# Patient Record
Sex: Female | Born: 1995 | Race: Black or African American | Hispanic: No | Marital: Single | Smoking: Never smoker
Health system: Southern US, Community
[De-identification: ages and names within clinical notes are randomized; demographics above are authoritative.]

## PROBLEM LIST (undated history)

## (undated) DIAGNOSIS — Z789 Other specified health status: Secondary | ICD-10-CM

## (undated) HISTORY — PX: NO PAST SURGERIES: SHX2092

---
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
## 2014-11-27 ENCOUNTER — Emergency Department (HOSPITAL_COMMUNITY)
Admission: EM | Admit: 2014-11-27 | Discharge: 2014-11-27 | Disposition: A | Discharge status: Home / Self Care | Payer: Managed Care, Other (non HMO) | Attending: Emergency Medicine | Admitting: Emergency Medicine

## 2014-11-27 ENCOUNTER — Encounter (HOSPITAL_COMMUNITY): Payer: Self-pay | Admitting: Emergency Medicine

## 2014-11-27 ENCOUNTER — Emergency Department (HOSPITAL_COMMUNITY): Payer: Managed Care, Other (non HMO)

## 2014-11-27 DIAGNOSIS — J069 Acute upper respiratory infection, unspecified: Secondary | ICD-10-CM

## 2014-11-27 DIAGNOSIS — R05 Cough: Secondary | ICD-10-CM | POA: Diagnosis present

## 2014-11-27 MED ORDER — ONDANSETRON 4 MG PO TBDP
4.0000 mg | ORAL_TABLET | Freq: Once | ORAL | Status: AC
  Administered 2014-11-27: 4 mg via ORAL
  Filled 2014-11-27: qty 1

## 2014-11-27 MED ORDER — AZITHROMYCIN 250 MG PO TABS: 250.0000 mg | ORAL_TABLET | Freq: Every day | ORAL | Status: DC

## 2014-11-27 NOTE — ED Notes (Signed)
Pt states that she has been having cough and nasal congestion x 2 days.

## 2014-11-27 NOTE — ED Notes (Signed)
Work Engineer, [employer] provided

## 2014-11-27 NOTE — Discharge Instructions (Signed)
Upper Respiratory Infection, Adult Most upper respiratory infections (URIs) are a viral infection of the air passages leading to the lungs. A URI affects the nose, throat, and upper air passages. The most common type of URI is nasopharyngitis and is typically referred to as "the common cold." URIs run their course and usually go away on their own. Most of the time, a URI does not require medical attention, but sometimes a bacterial infection in the upper airways can follow a viral infection. This is called a secondary infection. Sinus and middle ear infections are common types of secondary upper respiratory infections. Bacterial pneumonia can also complicate a URI. A URI can worsen asthma and chronic obstructive pulmonary disease (COPD). Sometimes, these complications can require emergency medical care and may be life threatening.  CAUSES Almost all URIs are caused by viruses. A virus is a type of germ and can spread from one person to another.  RISKS FACTORS You may be at risk for a URI if:   You smoke.   You have chronic heart or lung disease.  You have a weakened defense (immune) system.   You are very young or very old.   You have nasal allergies or asthma.  You work in crowded or poorly ventilated areas.  You work in health care facilities or schools. SIGNS AND SYMPTOMS  Symptoms typically develop 2-3 days after you come in contact with a cold virus. Most viral URIs last 7-10 days. However, viral URIs from the influenza virus (flu virus) can last 14-18 days and are typically more severe. Symptoms may include:   Runny or stuffy (congested) nose.   Sneezing.   Cough.   Sore throat.   Headache.   Fatigue.   Fever.   Loss of appetite.   Pain in your forehead, behind your eyes, and over your cheekbones (sinus pain).  Muscle aches.  DIAGNOSIS  Your health care provider may diagnose a URI by:  Physical exam.  Tests to check that your symptoms are not due to  another condition such as:  Strep throat.  Sinusitis.  Pneumonia.  Asthma. TREATMENT  A URI goes away on its own with time. It cannot be cured with medicines, but medicines may be prescribed or recommended to relieve symptoms. Medicines may help:  Reduce your fever.  Reduce your cough.  Relieve nasal congestion. HOME CARE INSTRUCTIONS   Take medicines only as directed by your health care provider.   Gargle warm saltwater or take cough drops to comfort your throat as directed by your health care provider.  Use a warm mist humidifier or inhale steam from a shower to increase air moisture. This may make it easier to breathe.  Drink enough fluid to keep your urine clear or pale yellow.   Eat soups and other clear broths and maintain good nutrition.   Rest as needed.   Return to work when your temperature has returned to normal or as your health care provider advises. You may need to stay home longer to avoid infecting others. You can also use a face mask and careful hand washing to prevent spread of the virus.  Increase the usage of your inhaler if you have asthma.   Do not use any tobacco products, including cigarettes, chewing tobacco, or electronic cigarettes. If you need help quitting, ask your health care provider. PREVENTION  The best way to protect yourself from getting a cold is to practice good hygiene.   Avoid oral or hand contact with people with cold   symptoms.   Wash your hands often if contact occurs.  There is no clear evidence that vitamin C, vitamin E, echinacea, or exercise reduces the chance of developing a cold. However, it is always recommended to get plenty of rest, exercise, and practice good nutrition.  SEEK MEDICAL CARE IF:   You are getting worse rather than better.   Your symptoms are not controlled by medicine.   You have chills.  You have worsening shortness of breath.  You have brown or red mucus.  You have yellow or brown nasal  discharge.  You have pain in your face, especially when you bend forward.  You have a fever.  You have swollen neck glands.  You have pain while swallowing.  You have white areas in the back of your throat. SEEK IMMEDIATE MEDICAL CARE IF:   You have severe or persistent:  Headache.  Ear pain.  Sinus pain.  Chest pain.  You have chronic lung disease and any of the following:  Wheezing.  Prolonged cough.  Coughing up blood.  A change in your usual mucus.  You have a stiff neck.  You have changes in your:  Vision.  Hearing.  Thinking.  Mood. MAKE SURE YOU:   Understand these instructions.  Will watch your condition.  Will get help right away if you are not doing well or get worse.   This information is not intended to replace advice given to you by your health care provider. Make sure you discuss any questions you have with your health care provider.   Document Released: 07/06/2000 Document Revised: 05/27/2014 Document Reviewed: 04/17/2013 Elsevier Interactive Patient Education 2016 [employer]  

## 2014-11-27 NOTE — ED Provider Notes (Signed)
CSN: [id number]     Arrival date & time 11/27/14  0944 History   First MD Initiated Contact with Patient 11/27/14 0945     Chief Complaint  Patient presents with  . Cough  . Nasal Congestion     (Consider location/radiation/quality/duration/timing/severity/associated sxs/prior Treatment) HPI  Patient to the ER for evaluation of cough and nasal congestion for the past two days. She has not had any fevers, nausea, vomiting, diarrhea, chest pain, weakness, confusion, wheezing. She denies sore throat, ear pain, neck pain or headaches. Her cough is dry and sometimes productive of light green sputum.   History reviewed. No pertinent past medical history. No past surgical history on file. No family history on file. Social History  Substance Use Topics  . Smoking status: Never Smoker   . Smokeless tobacco: None  . Alcohol Use: No   OB History    No data available     Review of Systems  10 Systems reviewed and are negative for acute change except as noted in the HPI.    Allergies  Review of patient's allergies indicates no known allergies.  Home Medications   Prior to Admission medications   Medication Sig Start Date End Date Taking? Authorizing Provider  azithromycin (ZITHROMAX) 250 MG tablet Take 1 tablet (250 mg total) by mouth daily. Take first 2 tablets together, then 1 every day until finished. 11/27/14   Jasline Buskirk Neva SeatGreene, PA-C   BP 129/74 mmHg  Pulse 105  Temp(Src) 98.6 F (37 C) (Oral)  Resp 19  SpO2 100%  LMP 11/13/2014 Physical Exam  Constitutional: She appears well-developed and well-nourished. No distress.  HENT:  Head: Normocephalic and atraumatic.  Eyes: Pupils are equal, round, and reactive to light.  Neck: Normal range of motion. Neck supple.  Cardiovascular: Normal rate and regular rhythm.   Pulmonary/Chest: Effort normal.  Abdominal: Soft.  Neurological: She is alert.  Skin: Skin is warm and dry.  Nursing note and vitals reviewed.     ED Course   Procedures (including critical care time) Labs Review Labs Reviewed - No data to display  Imaging Review Dg Chest 2 View  11/27/2014  CLINICAL DATA:  Cough and congestion.  Chest soreness for 2 days EXAM: CHEST  2 VIEW COMPARISON:  None. FINDINGS: Lungs are clear. The heart size and pulmonary vascularity are normal. No adenopathy. No pneumothorax. No bone lesions. IMPRESSION: No edema or consolidation. Electronically Signed   By: Bretta BangWilliam  Woodruff III M.D.   On: 11/27/2014 10:23   I have personally reviewed and evaluated these images and lab results as part of my medical decision-making.   EKG Interpretation None      MDM   Final diagnoses:  URI (upper respiratory infection)    Pt initially tachycardic then given 3 cups of water and her pulse improved to 103 in the room. Patient advised she needs to drink more water. Otherwise negative chest xray. No systemic symptoms or fevers.  Rx: Z-pack  Medications  ondansetron (ZOFRAN-ODT) disintegrating tablet 4 mg (not administered)    19 y.o.Savannah Carr's medical screening exam was performed and I feel the patient has had an appropriate workup for their chief complaint at this time and likelihood of emergent condition existing is low. They have been counseled on decision, discharge, follow up and which symptoms necessitate immediate return to the emergency department. They or their family verbally stated understanding and agreement with plan and discharged in stable condition.   Vital signs are stable at discharge. Filed Vitals:  11/27/14 1102  BP: 129/74  Pulse: 105  Temp:   Resp: [address], PA-C 11/27/14 1132  Arby Barrette, MD 11/27/14 [phone]

## 2014-11-27 NOTE — ED Notes (Signed)
Bed: WTR5 Expected date:  Expected time:  Means of arrival:  Comments: 

## 2015-03-18 ENCOUNTER — Other Ambulatory Visit: Payer: Self-pay | Admitting: Family Medicine

## 2015-03-18 ENCOUNTER — Other Ambulatory Visit (HOSPITAL_COMMUNITY)
Admission: RE | Admit: 2015-03-18 | Discharge: 2015-03-18 | Disposition: A | Discharge status: Home / Self Care | Payer: Managed Care, Other (non HMO) | Source: Ambulatory Visit | Attending: Family Medicine | Admitting: Family Medicine

## 2015-03-18 DIAGNOSIS — Z1151 Encounter for screening for human papillomavirus (HPV): Secondary | ICD-10-CM | POA: Diagnosis present

## 2015-03-18 DIAGNOSIS — Z01411 Encounter for gynecological examination (general) (routine) with abnormal findings: Secondary | ICD-10-CM | POA: Diagnosis present

## 2015-03-18 DIAGNOSIS — Z113 Encounter for screening for infections with a predominantly sexual mode of transmission: Secondary | ICD-10-CM | POA: Diagnosis present

## 2015-03-19 LAB — CYTOLOGY - PAP

## 2015-10-18 ENCOUNTER — Emergency Department (HOSPITAL_COMMUNITY)
Admission: EM | Admit: 2015-10-18 | Discharge: 2015-10-19 | Disposition: A | Discharge status: Home / Self Care | Payer: Managed Care, Other (non HMO) | Attending: Emergency Medicine | Admitting: Emergency Medicine

## 2015-10-18 ENCOUNTER — Emergency Department (HOSPITAL_COMMUNITY): Payer: Managed Care, Other (non HMO)

## 2015-10-18 ENCOUNTER — Encounter (HOSPITAL_COMMUNITY): Payer: Self-pay | Admitting: Emergency Medicine

## 2015-10-18 DIAGNOSIS — Z792 Long term (current) use of antibiotics: Secondary | ICD-10-CM | POA: Insufficient documentation

## 2015-10-18 DIAGNOSIS — R05 Cough: Secondary | ICD-10-CM | POA: Diagnosis present

## 2015-10-18 DIAGNOSIS — J069 Acute upper respiratory infection, unspecified: Secondary | ICD-10-CM | POA: Insufficient documentation

## 2015-10-18 MED ORDER — HYDROCODONE-HOMATROPINE 5-1.5 MG/5ML PO SYRP
5.0000 mL | ORAL_SOLUTION | Freq: Once | ORAL | Status: AC
  Administered 2015-10-18: 5 mL via ORAL
  Filled 2015-10-18: qty 5

## 2015-10-18 NOTE — ED Provider Notes (Signed)
[facility]-EMERGENCY DEPT Provider Note   CSN: [id number] Arrival date & time: 10/18/15  2248     History   Chief Complaint Chief Complaint  Patient presents with  . Cough  . Back Pain  . Generalized Body Aches    HPI Savannah Carr is a 20 y.o. female.  Patient presents to the ED with a chief complaint of cough.  She states that her symptoms started 2 days ago.  She states that she has associated sore throat, rhinorrhea, and body aches.  She endorses sick contacts at work.  Denies any history of asthma or COPD.  She denies any fever or chills.  There are no modifying factors.     The history is provided by the patient. No language interpreter was used.    History reviewed. No pertinent past medical history.  There are no active problems to display for this patient.   History reviewed. No pertinent surgical history.  OB History    No data available       Home Medications    Prior to Admission medications   Medication Sig Start Date End Date Taking? Authorizing Provider  azithromycin (ZITHROMAX) 250 MG tablet Take 1 tablet (250 mg total) by mouth daily. Take first 2 tablets together, then 1 every day until finished. 11/27/14   Marlon Pel, PA-C    Family History No family history on file.  Social History Social History  Substance Use Topics  . Smoking status: Never Smoker  . Smokeless tobacco: Never Used  . Alcohol use No     Allergies   Review of patient's allergies indicates no known allergies.   Review of Systems Review of Systems  Constitutional: Negative for chills and fever.  HENT: Positive for postnasal drip, rhinorrhea, sinus pressure, sneezing and sore throat.   Respiratory: Positive for cough. Negative for shortness of breath.   Cardiovascular: Negative for chest pain.  Gastrointestinal: Negative for abdominal pain, constipation, diarrhea, nausea and vomiting.  Genitourinary: Negative for dysuria.  All other systems reviewed and are  negative.    Physical Exam Updated Vital Signs BP 107/62 (BP Location: Left Arm)   Pulse 115   Temp 98.3 F (36.8 C) (Oral)   Ht 5\' 7"  (1.702 m)   LMP 07/18/2015 (Approximate)   SpO2 94%   Physical Exam Physical Exam  Constitutional: Pt  is oriented to person, place, and time. Appears well-developed and well-nourished. No distress. Coughing throughout exam HENT:  Head: Normocephalic and atraumatic.  Right Ear: Tympanic membrane, external ear and ear canal normal.  Left Ear: Tympanic membrane, external ear and ear canal normal.  Nose: Mucosal edema and moderate rhinorrhea present. No epistaxis. Right sinus exhibits no maxillary sinus tenderness and no frontal sinus tenderness. Left sinus exhibits no maxillary sinus tenderness and no frontal sinus tenderness.  Mouth/Throat: Uvula is midline and mucous membranes are normal. Mucous membranes are not pale and not cyanotic. No oropharyngeal exudate, moderate posterior oropharyngeal edema, posterior oropharyngeal erythema or tonsillar abscesses.  Eyes: Conjunctivae are normal. Pupils are equal, round, and reactive to light.  Neck: Normal range of motion and full passive range of motion without pain.  Cardiovascular: Normal rate and intact distal pulses.   Pulmonary/Chest: Effort normal and breath sounds normal. No stridor.  Clear and equal breath sounds without focal wheezes, rhonchi, rales  Abdominal: Soft. Bowel sounds are normal. There is no tenderness.  Musculoskeletal: Normal range of motion.  Lymphadenopathy:    Pthas no cervical adenopathy.  Neurological: Pt is alert  and oriented to person, place, and time.  Skin: Skin is warm and dry. No rash noted. Pt is not diaphoretic.  Psychiatric: Normal mood and affect.  Nursing note and vitals reviewed.    ED Treatments / Results  Labs (all labs ordered are listed, but only abnormal results are displayed) Labs Reviewed - No data to display  EKG  EKG Interpretation None        Radiology Dg Chest 2 View  Result Date: 10/18/2015 CLINICAL DATA:  Acute onset of shortness of breath, wheezing and dry cough. Central chest pain. Initial encounter. EXAM: CHEST  2 VIEW COMPARISON:  Chest radiograph performed 11/27/2014 FINDINGS: The lungs are well-aerated and clear. There is no evidence of focal opacification, pleural effusion or pneumothorax. The heart is normal in size; the mediastinal contour is within normal limits. No acute osseous abnormalities are seen. A metallic piercing is noted at the right nipple. IMPRESSION: No acute cardiopulmonary process seen. Electronically Signed   By: Roanna RaiderJeffery  Chang M.D.   On: 10/18/2015 23:18    Procedures Procedures (including critical care time)  Medications Ordered in ED Medications  HYDROcodone-homatropine (HYCODAN) 5-1.5 MG/5ML syrup 5 mL (5 mLs Oral Given 10/18/15 2336)     Initial Impression / Assessment and Plan / ED Course  I have reviewed the triage vital signs and the nursing notes.  Pertinent labs & imaging results that were available during my care of the patient were reviewed by me and considered in my medical decision making (see chart for details).  Clinical Course    Pt CXR negative for acute infiltrate.  Patient mildly tachycardic, but coughing through the exam, her symptoms are classic for URI.  I do not suspect PE. Patients symptoms are consistent with URI, likely viral etiology. Discussed that antibiotics are not indicated for viral infections. Pt will be discharged with symptomatic treatment.  Verbalizes understanding and is agreeable with plan. Pt is hemodynamically stable & in NAD prior to dc.   Final Clinical Impressions(s) / ED Diagnoses   Final diagnoses:  URI (upper respiratory infection)    New Prescriptions New Prescriptions   HYDROCODONE-HOMATROPINE (HYCODAN) 5-1.5 MG/5ML SYRUP    Take 5 mLs by mouth every 6 (six) hours as needed for cough.     Roxy Horsemanobert Auda Finfrock, PA-C 10/19/15 0025     Tilden FossaElizabeth Rees, MD 10/20/15 [phone]

## 2015-10-18 NOTE — ED Notes (Signed)
Patient transported to X-ray 

## 2015-10-18 NOTE — ED Triage Notes (Signed)
Pt from home with complaints of dry cough and lower back pain that all began yesterday. Back pain is located in the center of her lower back. Pt is tachycardic at time of assessment with a HR or 115/min at her radial pulse. Pt has clear lungs and has an oxygen saturation of 94% on RA. Pt states she is exposed to lots of different people at work so she could have been around someone sick.

## 2015-10-19 MED ORDER — HYDROCODONE-HOMATROPINE 5-1.5 MG/5ML PO SYRP: 5.0000 mL | ORAL_SOLUTION | Freq: Four times a day (QID) | ORAL | 0 refills | Status: DC | PRN

## 2015-10-19 MED ORDER — ALBUTEROL SULFATE HFA 108 (90 BASE) MCG/ACT IN AERS
2.0000 | INHALATION_SPRAY | RESPIRATORY_TRACT | Status: DC | PRN
  Filled 2015-10-19: qty 6.7

## 2015-10-19 NOTE — ED Notes (Signed)
Pt ambulatory and independent at discharge.  Verbalized understanding of discharge instructions 

## 2016-03-22 ENCOUNTER — Other Ambulatory Visit: Payer: Self-pay | Admitting: Family Medicine

## 2016-03-22 ENCOUNTER — Other Ambulatory Visit (HOSPITAL_COMMUNITY)
Admission: RE | Admit: 2016-03-22 | Discharge: 2016-03-22 | Disposition: A | Discharge status: Home / Self Care | Payer: Managed Care, Other (non HMO) | Source: Ambulatory Visit | Attending: Family Medicine | Admitting: Family Medicine

## 2016-03-22 DIAGNOSIS — Z0000 Encounter for general adult medical examination without abnormal findings: Secondary | ICD-10-CM | POA: Diagnosis present

## 2016-03-22 DIAGNOSIS — Z113 Encounter for screening for infections with a predominantly sexual mode of transmission: Secondary | ICD-10-CM | POA: Insufficient documentation

## 2016-03-25 LAB — CYTOLOGY - PAP
CHLAMYDIA, DNA PROBE: NEGATIVE
DIAGNOSIS: NEGATIVE
NEISSERIA GONORRHEA: NEGATIVE

## 2018-10-11 ENCOUNTER — Encounter (HOSPITAL_COMMUNITY): Payer: Self-pay | Admitting: *Deleted

## 2018-10-11 ENCOUNTER — Other Ambulatory Visit: Payer: Self-pay

## 2018-10-11 ENCOUNTER — Inpatient Hospital Stay (HOSPITAL_COMMUNITY)
Admission: AD | Admit: 2018-10-11 | Discharge: 2018-10-11 | Disposition: A | Discharge status: Home / Self Care | Payer: Managed Care, Other (non HMO) | Attending: Obstetrics and Gynecology | Admitting: Obstetrics and Gynecology

## 2018-10-11 DIAGNOSIS — O219 Vomiting of pregnancy, unspecified: Secondary | ICD-10-CM

## 2018-10-11 DIAGNOSIS — Z3A01 Less than 8 weeks gestation of pregnancy: Secondary | ICD-10-CM | POA: Diagnosis not present

## 2018-10-11 DIAGNOSIS — O210 Mild hyperemesis gravidarum: Secondary | ICD-10-CM | POA: Diagnosis present

## 2018-10-11 DIAGNOSIS — E860 Dehydration: Secondary | ICD-10-CM

## 2018-10-11 DIAGNOSIS — O211 Hyperemesis gravidarum with metabolic disturbance: Secondary | ICD-10-CM | POA: Diagnosis not present

## 2018-10-11 LAB — COMPREHENSIVE METABOLIC PANEL
ALT: 16 U/L (ref 0–44)
AST: 17 U/L (ref 15–41)
Albumin: 4.4 g/dL (ref 3.5–5.0)
Alkaline Phosphatase: 47 U/L (ref 38–126)
Anion gap: 13 (ref 5–15)
BUN: 5 mg/dL — ABNORMAL LOW (ref 6–20)
CO2: 22 mmol/L (ref 22–32)
Calcium: 9.8 mg/dL (ref 8.9–10.3)
Chloride: 99 mmol/L (ref 98–111)
Creatinine, Ser: 0.69 mg/dL (ref 0.44–1.00)
GFR calc Af Amer: >60 mL/min (ref >60)
GFR calc non Af Amer: >60 mL/min (ref >60)
Glucose, Bld: 83 mg/dL (ref 70–99)
Potassium: 3.8 mmol/L (ref 3.5–5.1)
Sodium: 134 mmol/L — ABNORMAL LOW (ref 135–145)
Total Bilirubin: 0.7 mg/dL (ref 0.3–1.2)
Total Protein: 8.1 g/dL (ref 6.5–8.1)

## 2018-10-11 LAB — URINALYSIS, ROUTINE W REFLEX MICROSCOPIC
Bacteria, UA: NONE SEEN
Bilirubin Urine: NEGATIVE
Glucose, UA: NEGATIVE mg/dL
Hgb urine dipstick: NEGATIVE
Ketones, ur: 80 mg/dL — AB
Leukocytes,Ua: NEGATIVE
Nitrite: NEGATIVE
Protein, ur: 30 mg/dL — AB
Specific Gravity, Urine: 1.024 (ref 1.005–1.030)
pH: 6 (ref 5.0–8.0)

## 2018-10-11 LAB — CBC
HCT: 42.6 % (ref 36.0–46.0)
Hemoglobin: 15.1 g/dL — ABNORMAL HIGH (ref 12.0–15.0)
MCH: 32.1 pg (ref 26.0–34.0)
MCHC: 35.4 g/dL (ref 30.0–36.0)
MCV: 90.6 fL (ref 80.0–100.0)
Platelets: 298 10*3/uL (ref 150–400)
RBC: 4.7 MIL/uL (ref 3.87–5.11)
RDW: 11 % — ABNORMAL LOW (ref 11.5–15.5)
WBC: 7.5 10*3/uL (ref 4.0–10.5)
nRBC: 0 % (ref 0.0–0.2)

## 2018-10-11 LAB — POCT PREGNANCY, URINE: Preg Test, Ur: POSITIVE — AB

## 2018-10-11 MED ORDER — LACTATED RINGERS IV BOLUS
1000.0000 mL | Freq: Once | INTRAVENOUS | Status: AC
  Administered 2018-10-11: 1000 mL via INTRAVENOUS

## 2018-10-11 MED ORDER — METOCLOPRAMIDE HCL 10 MG PO TABS: 10.0000 mg | ORAL_TABLET | Freq: Three times a day (TID) | ORAL | 2 refills | Status: DC

## 2018-10-11 MED ORDER — FAMOTIDINE IN NACL 20-0.9 MG/50ML-% IV SOLN
20.0000 mg | Freq: Once | INTRAVENOUS | Status: AC
  Administered 2018-10-11: 20 mg via INTRAVENOUS
  Filled 2018-10-11: qty 50

## 2018-10-11 MED ORDER — PROMETHAZINE HCL 25 MG PO TABS: 12.5000 mg | ORAL_TABLET | Freq: Four times a day (QID) | ORAL | 2 refills | Status: DC | PRN

## 2018-10-11 MED ORDER — PROMETHAZINE HCL 25 MG/ML IJ SOLN
12.5000 mg | Freq: Once | INTRAMUSCULAR | Status: AC
  Administered 2018-10-11: 12.5 mg via INTRAVENOUS
  Filled 2018-10-11: qty 1

## 2018-10-11 NOTE — MAU Note (Signed)
Has been really nauseous for a couple days, unable to hold anything down.  Is feeling really weak. Has tried some type of medication for nausea, unable to keep it down.  Has not been seen with preg.

## 2018-10-11 NOTE — MAU Provider Note (Signed)
Chief Complaint: Nausea and Emesis   First Provider Initiated Contact with Patient 10/11/18 1603      SUBJECTIVE HPI: Savannah Carr is a 23 y.o. G1P0 at [redacted]w[redacted]d by LMP who presents to maternity admissions reporting nausea and vomiting x 2 days.  She reports vomiting x 2 in 24 hours but is unable to keep down any liquids or food.  She denies any pain or vaginal bleeding. There are no other symptoms.  She has not tried any treatments. She has new OB visit next week with [facility].   HPI  History reviewed. No pertinent past medical history. History reviewed. No pertinent surgical history. Social History   Socioeconomic History  . Marital status: Single    Spouse name: Not on file  . Number of children: Not on file  . Years of education: Not on file  . Highest education level: Not on file  Occupational History  . Not on file  Social Needs  . Financial resource strain: Not on file  . Food insecurity    Worry: Not on file    Inability: Not on file  . Transportation needs    Medical: Not on file    Non-medical: Not on file  Tobacco Use  . Smoking status: Never Smoker  . Smokeless tobacco: Never Used  Substance and Sexual Activity  . Alcohol use: No  . Drug use: No  . Sexual activity: Not Currently  Lifestyle  . Physical activity    Days per week: Not on file    Minutes per session: Not on file  . Stress: Not on file  Relationships  . Social Herbalist on phone: Not on file    Gets together: Not on file    Attends religious service: Not on file    Active member of club or organization: Not on file    Attends meetings of clubs or organizations: Not on file    Relationship status: Not on file  . Intimate partner violence    Fear of current or ex partner: Not on file    Emotionally abused: Not on file    Physically abused: Not on file    Forced sexual activity: Not on file  Other Topics Concern  . Not on file  Social History Narrative  . Not on file    No current facility-administered medications on file prior to encounter.    No current outpatient medications on file prior to encounter.   No Known Allergies  ROS:  Review of Systems  Constitutional: Negative for chills, fatigue and fever.  Respiratory: Negative for shortness of breath.   Cardiovascular: Negative for chest pain.  Gastrointestinal: Positive for nausea and vomiting.  Genitourinary: Positive for pelvic pain. Negative for difficulty urinating, dysuria, flank pain, vaginal bleeding, vaginal discharge and vaginal pain.  Neurological: Negative for dizziness and headaches.  Psychiatric/Behavioral: Negative.      I have reviewed patient's Past Medical Hx, Surgical Hx, Family Hx, Social Hx, medications and allergies.   Physical Exam   Patient Vitals for the past 24 hrs:  BP Temp Temp src Pulse Resp SpO2 Height Weight  10/11/18 1857 110/60 - - 98 - - - -  10/11/18 1353 121/70 98.3 F (36.8 C) Oral (!) 112 16 100 % 5\' 7"  (1.702 m) 59.6 kg   Constitutional: Well-developed, well-nourished female in no acute distress.  Cardiovascular: normal rate Respiratory: normal effort GI: Abd soft, non-tender. Pos BS x 4 MS: Extremities nontender, no edema, normal  ROM Neurologic: Alert and oriented x 4.  GU: Neg CVAT.  PELVIC EXAM: Deferred   LAB RESULTS Results for orders placed or performed during the hospital encounter of 10/11/18 (from the past 24 hour(s))  Urinalysis, Routine w reflex microscopic     Status: Abnormal   Collection Time: 10/11/18  2:00 PM  Result Value Ref Range   Color, Urine YELLOW YELLOW   APPearance HAZY (A) CLEAR   Specific Gravity, Urine 1.024 1.005 - 1.030   pH 6.0 5.0 - 8.0   Glucose, UA NEGATIVE NEGATIVE mg/dL   Hgb urine dipstick NEGATIVE NEGATIVE   Bilirubin Urine NEGATIVE NEGATIVE   Ketones, ur 80 (A) NEGATIVE mg/dL   Protein, ur 30 (A) NEGATIVE mg/dL   Nitrite NEGATIVE NEGATIVE   Leukocytes,Ua NEGATIVE NEGATIVE   RBC / HPF 0-5 0 - 5  RBC/hpf   WBC, UA 0-5 0 - 5 WBC/hpf   Bacteria, UA NONE SEEN NONE SEEN   Squamous Epithelial / LPF 0-5 0 - 5   Mucus PRESENT   Pregnancy, urine POC     Status: Abnormal   Collection Time: 10/11/18  2:00 PM  Result Value Ref Range   Preg Test, Ur POSITIVE (A) NEGATIVE  CBC     Status: Abnormal   Collection Time: 10/11/18  3:57 PM  Result Value Ref Range   WBC 7.5 4.0 - 10.5 K/uL   RBC 4.70 3.87 - 5.11 MIL/uL   Hemoglobin 15.1 (H) 12.0 - 15.0 g/dL   HCT 16.142.6 09.636.0 - 04.546.0 %   MCV 90.6 80.0 - 100.0 fL   MCH 32.1 26.0 - 34.0 pg   MCHC 35.4 30.0 - 36.0 g/dL   RDW 40.911.0 (L) 81.111.5 - 91.415.5 %   Platelets 298 150 - 400 K/uL   nRBC 0.0 0.0 - 0.2 %  Comprehensive metabolic panel     Status: Abnormal   Collection Time: 10/11/18  3:57 PM  Result Value Ref Range   Sodium 134 (L) 135 - 145 mmol/L   Potassium 3.8 3.5 - 5.1 mmol/L   Chloride 99 98 - 111 mmol/L   CO2 22 22 - 32 mmol/L   Glucose, Bld 83 70 - 99 mg/dL   BUN 5 (L) 6 - 20 mg/dL   Creatinine, Ser 7.820.69 0.44 - 1.00 mg/dL   Calcium 9.8 8.9 - 95.610.3 mg/dL   Total Protein 8.1 6.5 - 8.1 g/dL   Albumin 4.4 3.5 - 5.0 g/dL   AST 17 15 - 41 U/L   ALT 16 0 - 44 U/L   Alkaline Phosphatase 47 38 - 126 U/L   Total Bilirubin 0.7 0.3 - 1.2 mg/dL   GFR calc non Af Amer >60 >60 mL/min   GFR calc Af Amer >60 >60 mL/min   Anion gap 13 5 - 15       IMAGING No results found.  MAU Management/MDM: Orders Placed This Encounter  Procedures  . Urinalysis, Routine w reflex microscopic  . CBC  . Comprehensive metabolic panel  . Pregnancy, urine POC  . Discharge patient    Meds ordered this encounter  Medications  . lactated ringers bolus 1,000 mL  . promethazine (PHENERGAN) injection 12.5 mg  . famotidine (PEPCID) IVPB 20 mg premix  . lactated ringers bolus 1,000 mL  . metoCLOPramide (REGLAN) 10 MG tablet    Sig: Take 1 tablet (10 mg total) by mouth 3 (three) times daily before meals.    Dispense:  30 tablet    Refill:  2  Order Specific  Question:   Supervising Provider    Answer:   Adam Phenix [[id number]]  . promethazine (PHENERGAN) 25 MG tablet    Sig: Take 0.5-1 tablets (12.5-25 mg total) by mouth every 6 (six) hours as needed for nausea.    Dispense:  30 tablet    Refill:  2    Order Specific Question:   Supervising Provider    Answer:   Adam Phenix [[id number]]    Pt with mild dehydration on UA so IV fluids x 2 liters given along with Phenergan 12.5 mg IV and Pepcid 20 mg IV. Pt with significant improvement and tolerated PO food/fluids.  D/C home. Rx for Reglan TID with meals and Phenergan Q HS PRN. F/U with early prenatal care as planned. Pt discharged with strict return precautions.  ASSESSMENT 1. Nausea and vomiting during pregnancy prior to [redacted] weeks gestation   2. Mild dehydration     PLAN Discharge home Allergies as of 10/11/2018   No Known Allergies     Medication List    STOP taking these medications   azithromycin 250 MG tablet Commonly known as: ZITHROMAX   HYDROcodone-homatropine 5-1.5 MG/5ML syrup Commonly known as: HYCODAN     TAKE these medications   metoCLOPramide 10 MG tablet Commonly known as: REGLAN Take 1 tablet (10 mg total) by mouth 3 (three) times daily before meals.   promethazine 25 MG tablet Commonly known as: PHENERGAN Take 0.5-1 tablets (12.5-25 mg total) by mouth every 6 (six) hours as needed for nausea.      Follow-up Information    Gynecology, [facility]etric[facility] And Follow up.   Specialty: Obstetrics and Gynecology Why: As scheduled, return to MAU as needed for emergencies. Contact information: [address] [place] [address] [phone]           Sharen Counter Certified Nurse-Midwife 10/11/2018  7:52 PM

## 2018-11-16 ENCOUNTER — Other Ambulatory Visit: Payer: Self-pay | Admitting: Obstetrics and Gynecology

## 2018-11-16 ENCOUNTER — Other Ambulatory Visit (HOSPITAL_COMMUNITY)
Admission: RE | Admit: 2018-11-16 | Discharge: 2018-11-16 | Disposition: A | Discharge status: Home / Self Care | Payer: Managed Care, Other (non HMO) | Source: Ambulatory Visit | Attending: Obstetrics and Gynecology | Admitting: Obstetrics and Gynecology

## 2018-11-16 DIAGNOSIS — Z3490 Encounter for supervision of normal pregnancy, unspecified, unspecified trimester: Secondary | ICD-10-CM | POA: Insufficient documentation

## 2018-11-19 ENCOUNTER — Other Ambulatory Visit: Payer: Self-pay

## 2018-11-19 ENCOUNTER — Encounter (HOSPITAL_COMMUNITY): Payer: Self-pay

## 2018-11-19 ENCOUNTER — Inpatient Hospital Stay (HOSPITAL_COMMUNITY)
Admission: AD | Admit: 2018-11-19 | Discharge: 2018-11-19 | Disposition: A | Discharge status: Home / Self Care | Payer: Managed Care, Other (non HMO) | Attending: Obstetrics and Gynecology | Admitting: Obstetrics and Gynecology

## 2018-11-19 DIAGNOSIS — R42 Dizziness and giddiness: Secondary | ICD-10-CM | POA: Diagnosis present

## 2018-11-19 DIAGNOSIS — O26891 Other specified pregnancy related conditions, first trimester: Secondary | ICD-10-CM | POA: Diagnosis not present

## 2018-11-19 DIAGNOSIS — R55 Syncope and collapse: Secondary | ICD-10-CM | POA: Diagnosis not present

## 2018-11-19 DIAGNOSIS — Z3A12 12 weeks gestation of pregnancy: Secondary | ICD-10-CM | POA: Insufficient documentation

## 2018-11-19 HISTORY — DX: Other specified health status: Z78.9

## 2018-11-19 LAB — URINALYSIS, ROUTINE W REFLEX MICROSCOPIC
Bacteria, UA: NONE SEEN
Bilirubin Urine: NEGATIVE
Glucose, UA: NEGATIVE mg/dL
Hgb urine dipstick: NEGATIVE
Ketones, ur: NEGATIVE mg/dL
Leukocytes,Ua: NEGATIVE
Nitrite: NEGATIVE
Protein, ur: NEGATIVE mg/dL
Specific Gravity, Urine: 1.015 (ref 1.005–1.030)
pH: 7.5 (ref 5.0–8.0)

## 2018-11-19 LAB — BASIC METABOLIC PANEL
Anion gap: 8 (ref 5–15)
BUN: 8 mg/dL (ref 6–20)
CO2: 21 mmol/L — ABNORMAL LOW (ref 22–32)
Calcium: 9 mg/dL (ref 8.9–10.3)
Chloride: 105 mmol/L (ref 98–111)
Creatinine, Ser: 0.54 mg/dL (ref 0.44–1.00)
GFR calc Af Amer: >60 mL/min (ref >60)
GFR calc non Af Amer: >60 mL/min (ref >60)
Glucose, Bld: 81 mg/dL (ref 70–99)
Potassium: 3.9 mmol/L (ref 3.5–5.1)
Sodium: 134 mmol/L — ABNORMAL LOW (ref 135–145)

## 2018-11-19 LAB — CBC
HCT: 31.1 % — ABNORMAL LOW (ref 36.0–46.0)
Hemoglobin: 10.9 g/dL — ABNORMAL LOW (ref 12.0–15.0)
MCH: 32.7 pg (ref 26.0–34.0)
MCHC: 35 g/dL (ref 30.0–36.0)
MCV: 93.4 fL (ref 80.0–100.0)
Platelets: 314 10*3/uL (ref 150–400)
RBC: 3.33 MIL/uL — ABNORMAL LOW (ref 3.87–5.11)
RDW: 12 % (ref 11.5–15.5)
WBC: 7.6 10*3/uL (ref 4.0–10.5)
nRBC: 0 % (ref 0.0–0.2)

## 2018-11-19 NOTE — Discharge Instructions (Signed)
It sounds like you had an episode of vasovagal near syncope which can cause you the sensation of feeling like you are about to pass out due to lack of blood flow to your brain. It can occur when you change positions too quickly, when you are stressed, when you are dehydrated and for a variety of other non-emergent reasons. Your urinalysis did not show any evidence of infection or dehydration. Your blood counts were normal without elevated inflammation/infection markers and not significant anemia (low iron counts). Your electrolytes and kidneys were normal. Your vital signs were all normal as well. The EKG showed a normal heart rhythm. Everything looked very reassuring. Make sure you get plenty of rest, drink lots of fluid, and eat frequently throughout the day.   Near-Syncope Near-syncope is when you suddenly feel like you might pass out (faint), but you do not actually lose consciousness. This may also be referred to as presyncope. During an episode of near-syncope, you may:  Feel dizzy, weak, or light-headed.  Feel nauseous.  See all white or all black in your field of vision, or see spots.  Have cold, clammy skin. This condition is caused by a sudden decrease in blood flow to the brain. This decrease can result from various causes, but most of those causes are not dangerous. However, near-syncope may be a sign of a serious medical problem, so it is important to seek medical care. Follow these instructions at home: Medicines  Take over-the-counter and prescription medicines only as told by your health care provider.  If you are taking blood pressure or heart medicine, get up slowly and take several minutes to sit and then stand. This can reduce dizziness. General instructions  Pay attention to any changes in your symptoms.  Talk with your health care provider about your symptoms. You may need to have testing to understand the cause of your near-syncope.  If you start to feel like you might  faint, lie down right away and raise (elevate) your feet above the level of your heart. Breathe deeply and steadily. Wait until all of the symptoms have passed.  Have someone stay with you until you feel stable.  Do not drive, use machinery, or play sports until your health care provider says it is okay.  Drink enough fluid to keep your urine pale yellow.  Keep all follow-up visits as told by your health care provider. This is important. Get help right away if you:  Have a seizure.  Have unusual pain in your chest, abdomen, or back.  Faint once or repeatedly.  Have a severe headache.  Are bleeding from your mouth or rectum, or you have black or tarry stool.  Have a very fast or irregular heartbeat (palpitations).  Are confused.  Have trouble walking.  Have severe weakness.  Have vision problems. These symptoms may represent a serious problem that is an emergency. Do not wait to see if your symptoms will go away. Get medical help right away. Call your local emergency services (911 in the [place] Do not drive yourself to the hospital. Summary  Near-syncope is when you suddenly feel like you might pass out (faint), but you do not actually lose consciousness.  This condition is caused by a sudden decrease in blood flow to the brain. This decrease can result from various causes, but most of those causes are not dangerous.  Near-syncope may be a sign of a serious medical problem, so it is important to seek medical care. This information is not  intended to replace advice given to you by your health care provider. Make sure you discuss any questions you have with your health care provider. Document Released: 01/10/2005 Document Revised: 05/04/2018 Document Reviewed: 11/29/2017 Elsevier Patient Education  2020 [employer] Inc[employer]  Dizziness Dizziness is a common problem. It makes you feel unsteady or light-headed. You may feel like you are about to pass out (faint). Dizziness can lead to  getting hurt if you stumble or fall. Dizziness can be caused by many things, including:  Medicines.  Not having enough water in your body (dehydration).  Illness. Follow these instructions at home: Eating and drinking   Drink enough fluid to keep your pee (urine) clear or pale yellow. This helps to keep you from getting dehydrated. Try to drink more clear fluids, such as water.  Do not drink alcohol.  Limit how much caffeine you drink or eat, if your doctor tells you to do that.  Limit how much salt (sodium) you drink or eat, if your doctor tells you to do that. Activity   Avoid making quick movements. ? When you stand up from sitting in a chair, steady yourself until you feel okay. ? In the morning, first sit up on the side of the bed. When you feel okay, stand slowly while you hold onto something. Do this until you know that your balance is fine.  If you need to stand in one place for a long time, move your legs often. Tighten and relax the muscles in your legs while you are standing.  Do not drive or use heavy machinery if you feel dizzy.  Avoid bending down if you feel dizzy. Place items in your home so you can reach them easily without leaning over. Lifestyle  Do not use any products that contain nicotine or tobacco, such as cigarettes and e-cigarettes. If you need help quitting, ask your doctor.  Try to lower your stress level. You can do this by using methods such as yoga or meditation. Talk with your doctor if you need help. General instructions  Watch your dizziness for any changes.  Take over-the-counter and prescription medicines only as told by your doctor. Talk with your doctor if you think that you are dizzy because of a medicine that you are taking.  Tell a friend or a family member that you are feeling dizzy. If he or she notices any changes in your behavior, have this person call your doctor.  Keep all follow-up visits as told by your doctor. This is  important. Contact a doctor if:  Your dizziness does not go away.  Your dizziness or light-headedness gets worse.  You feel sick to your stomach (nauseous).  You have trouble hearing.  You have new symptoms.  You are unsteady on your feet.  You feel like the room is spinning. Get help right away if:  You throw up (vomit) or have watery poop (diarrhea), and you cannot eat or drink anything.  You have trouble: ? Talking. ? Walking. ? Swallowing. ? Using your arms, hands, or legs.  You feel generally weak.  You are not thinking clearly, or you have trouble forming sentences. A friend or family member may notice this.  You have: ? Chest pain. ? Pain in your belly (abdomen). ? Shortness of breath. ? Sweating.  Your vision changes.  You are bleeding.  You have a very bad headache.  You have neck pain or a stiff neck.  You have a fever. These symptoms may be an  emergency. Do not wait to see if the symptoms will go away. Get medical help right away. Call your local emergency services (911 in the [place] Do not drive yourself to the hospital. Summary  Dizziness makes you feel unsteady or light-headed. You may feel like you are about to pass out (faint).  Drink enough fluid to keep your pee (urine) clear or pale yellow. Do not drink alcohol.  Avoid making quick movements if you feel dizzy.  Watch your dizziness for any changes. This information is not intended to replace advice given to you by your health care provider. Make sure you discuss any questions you have with your health care provider. Document Released: 12/30/2010 Document Revised: 01/13/2017 Document Reviewed: 01/28/2016 Elsevier Patient Education  2020 [employer].

## 2018-11-19 NOTE — MAU Provider Note (Signed)
History     CSN: [id number]  Arrival date and time: 11/19/18 1147   First Provider Initiated Contact with Patient 11/19/18 1300      Chief Complaint  Patient presents with  . Dizziness   HPI   Savannah Carr is 23 y.o. G1P0 female at [redacted]w[redacted]d who presents for episode of near syncope this morning. Reports that she was at a meeting at work this morning and while she was standing she started to feel light headed and dizzy. Her vision tunneled and she felt like she was going to pass out. She had to sit down. Did not have LOC, fall or hit her head. Endorses a mild headache that she has had throughout today. Has a history of headaches, although they've become more frequent with pregnancy.   Patient ate breakfast this morning, reports having ravioli. She drank water with breakfast. No caffeine intake this morning.   She denies prior episodes of syncope or near syncope. Denies cardiac or neuro history.   Currently, patient feels normal. She is not experiencing any lightheadedness. No sensation of room spinning.   OB History    Gravida  1   Para      Term      Preterm      AB      Living        SAB      TAB      Ectopic      Multiple      Live Births              Past Medical History:  Diagnosis Date  . Medical history non-contributory     Past Surgical History:  Procedure Laterality Date  . NO PAST SURGERIES      History reviewed. No pertinent family history.  Social History   Tobacco Use  . Smoking status: Never Smoker  . Smokeless tobacco: Never Used  Substance Use Topics  . Alcohol use: No  . Drug use: No    Allergies: No Known Allergies  Medications Prior to Admission  Medication Sig Dispense Refill Last Dose  . metoCLOPramide (REGLAN) 10 MG tablet Take 1 tablet (10 mg total) by mouth 3 (three) times daily before meals. 30 tablet 2   . promethazine (PHENERGAN) 25 MG tablet Take 0.5-1 tablets (12.5-25 mg total) by mouth every 6 (six) hours  as needed for nausea. 30 tablet 2     Review of Systems  Constitutional: Negative for activity change, appetite change, chills and fever.  Eyes: Positive for visual disturbance.  Respiratory: Negative for chest tightness, shortness of breath and wheezing.   Cardiovascular: Negative for chest pain and palpitations.  Gastrointestinal: Negative for abdominal pain, diarrhea, nausea and vomiting.  Genitourinary: Negative for dysuria.  Musculoskeletal: Negative for back pain and myalgias.  Skin: Negative for rash.  Neurological: Positive for dizziness, light-headedness and headaches. Negative for seizures and weakness.  Psychiatric/Behavioral: Negative for confusion.   Physical Exam   Blood pressure (!) 111/46, pulse 92, temperature 98.5 F (36.9 C), resp. rate 16, weight 62 kg, last menstrual period 08/25/2018, SpO2 100 %.  Physical Exam  Constitutional: She is oriented to person, place, and time. She appears well-developed and well-nourished. No distress.  HENT:  Head: Normocephalic and atraumatic.  Mouth/Throat: Oropharynx is clear and moist.  Eyes: Pupils are equal, round, and reactive to light. Conjunctivae and EOM are normal.  Neck: Normal range of motion. Neck supple.  Cardiovascular: Normal rate, regular rhythm, normal heart sounds and  intact distal pulses.  No murmur heard. Respiratory: Effort normal and breath sounds normal. No respiratory distress.  GI: Soft. She exhibits no distension. There is no abdominal tenderness. There is no rebound and no guarding.  Musculoskeletal: Normal range of motion.        General: No edema.  Neurological: She is alert and oriented to person, place, and time. No cranial nerve deficit. She exhibits normal muscle tone. Coordination normal.  Skin: Skin is warm and dry. She is not diaphoretic.  Psychiatric: She has a normal mood and affect. Her behavior is normal.   FHR by doppler: 154 bpm   MAU Course  Procedures  MDM Will obtain UA, EKG,  CBC, and BMET.   Assessment and Plan   1. Near syncope   2. Dizziness   3. [redacted] weeks gestation of pregnancy    Patient presenting with near syncope. Story sounds consistent with vasovagal type near syncope of benign origin. UA without signs of dehydration. EKG NSR. CBC without elevated WBC or significantly low HgB. BMET with normal electrolytes, glucose and kidney function. Orthostatic vital signs within normal limits. Patient has not recently taken any medications. Has no significant past medical history. Neurological exam normal. Have ruled out serious etiologies of near syncope. Encouraged adequate hydration, frequent small meals and good rest. Strict return precautions discussed.   Melina Schools 11/19/2018, 1:06 PM

## 2018-11-19 NOTE — MAU Note (Signed)
.   Savannah Carr is a 22 y.o. at [redacted]w[redacted]d here in MAU reporting: feeling lightheaded today at work. Reports headaches on and off. Has taken tylenol for her head today LMP: 08/25/18 Onset of complaint: today Pain score: 8 Vitals:   11/19/18 1214  BP: (!) 111/46  Pulse: 92  Resp: 16  Temp: 98.5 F (36.9 C)  SpO2: 100%     FHT:154 Lab orders placed from triage: UA

## 2018-11-21 ENCOUNTER — Other Ambulatory Visit: Payer: Self-pay | Admitting: Obstetrics and Gynecology

## 2018-11-23 LAB — CYTOLOGY - PAP
Chlamydia: NEGATIVE
Comment: NEGATIVE
Comment: NEGATIVE
Comment: NORMAL
High risk HPV: POSITIVE — AB
Neisseria Gonorrhea: NEGATIVE

## 2019-01-03 LAB — OB RESULTS CONSOLE HEPATITIS B SURFACE ANTIGEN: Hepatitis B Surface Ag: NEGATIVE

## 2019-01-03 LAB — OB RESULTS CONSOLE HIV ANTIBODY (ROUTINE TESTING): HIV: NONREACTIVE

## 2019-01-03 LAB — OB RESULTS CONSOLE ANTIBODY SCREEN: Antibody Screen: NEGATIVE

## 2019-01-03 LAB — OB RESULTS CONSOLE ABO/RH: RH Type: POSITIVE

## 2019-01-03 LAB — OB RESULTS CONSOLE RPR: RPR: NONREACTIVE

## 2019-01-03 LAB — OB RESULTS CONSOLE GC/CHLAMYDIA
Chlamydia: NEGATIVE
Gonorrhea: NEGATIVE

## 2019-01-03 LAB — OB RESULTS CONSOLE RUBELLA ANTIBODY, IGM: Rubella: IMMUNE

## 2019-03-25 ENCOUNTER — Encounter (HOSPITAL_COMMUNITY): Payer: Self-pay | Admitting: Obstetrics and Gynecology

## 2019-03-25 ENCOUNTER — Inpatient Hospital Stay (HOSPITAL_COMMUNITY)
Admission: AD | Admit: 2019-03-25 | Discharge: 2019-03-25 | Disposition: A | Discharge status: Home / Self Care | Payer: Managed Care, Other (non HMO) | Attending: Obstetrics and Gynecology | Admitting: Obstetrics and Gynecology

## 2019-03-25 ENCOUNTER — Other Ambulatory Visit: Payer: Self-pay

## 2019-03-25 DIAGNOSIS — Z3A30 30 weeks gestation of pregnancy: Secondary | ICD-10-CM

## 2019-03-25 DIAGNOSIS — O26893 Other specified pregnancy related conditions, third trimester: Secondary | ICD-10-CM | POA: Diagnosis not present

## 2019-03-25 DIAGNOSIS — O26899 Other specified pregnancy related conditions, unspecified trimester: Secondary | ICD-10-CM

## 2019-03-25 DIAGNOSIS — Z3689 Encounter for other specified antenatal screening: Secondary | ICD-10-CM

## 2019-03-25 DIAGNOSIS — R109 Unspecified abdominal pain: Secondary | ICD-10-CM | POA: Diagnosis present

## 2019-03-25 LAB — URINALYSIS, ROUTINE W REFLEX MICROSCOPIC
Bilirubin Urine: NEGATIVE
Glucose, UA: NEGATIVE mg/dL
Hgb urine dipstick: NEGATIVE
Ketones, ur: 20 mg/dL — AB
Nitrite: NEGATIVE
Protein, ur: NEGATIVE mg/dL
Specific Gravity, Urine: 1.019 (ref 1.005–1.030)
pH: 7 (ref 5.0–8.0)

## 2019-03-25 LAB — WET PREP, GENITAL
Sperm: NONE SEEN
Trich, Wet Prep: NONE SEEN
Yeast Wet Prep HPF POC: NONE SEEN

## 2019-03-25 NOTE — MAU Provider Note (Signed)
History     CSN: [id number]  Arrival date and time: 03/25/19 0808   First Provider Initiated Contact with Patient 03/25/19 [phone]      Chief Complaint  Patient presents with  . Abdominal Pain  . Decreased Fetal Movement   24 y.o. G1 @30 .2 wks presenting with cramping and tightening. Reports onset yesterday. Reports emesis twice yesterday which has been ongoing during the pregnancy and she treats with Phenergan. She admits to poor hydration yesterday. She had a small amt of water today. She was feeling the pain q15-20 min and lasting about 2 min. Pain has gotten better today. She has not treated it with anything. Denies VB or LOF. Denies vaginal discharge, itching or malodor. +FM. Denies urinary sx.   OB History    Gravida  1   Para      Term      Preterm      AB      Living        SAB      TAB      Ectopic      Multiple      Live Births              Past Medical History:  Diagnosis Date  . Medical history non-contributory     Past Surgical History:  Procedure Laterality Date  . NO PAST SURGERIES      History reviewed. No pertinent family history.  Social History   Tobacco Use  . Smoking status: Never Smoker  . Smokeless tobacco: Never Used  Substance Use Topics  . Alcohol use: No  . Drug use: No    Allergies: No Known Allergies  Medications Prior to Admission  Medication Sig Dispense Refill Last Dose  . Prenatal Vit-Fe Fumarate-FA (PRENATAL MULTIVITAMIN) TABS tablet Take 1 tablet by mouth daily at 12 noon.   Past Week at Unknown time  . promethazine (PHENERGAN) 25 MG tablet Take 0.5-1 tablets (12.5-25 mg total) by mouth every 6 (six) hours as needed for nausea. 30 tablet 2 Past Week at Unknown time  . metoCLOPramide (REGLAN) 10 MG tablet Take 1 tablet (10 mg total) by mouth 3 (three) times daily before meals. 30 tablet 2     Review of Systems  Constitutional: Negative for chills and fever.  Gastrointestinal: Positive for abdominal pain,  nausea and vomiting. Negative for constipation and diarrhea.  Genitourinary: Negative for vaginal bleeding and vaginal discharge.   Physical Exam   Blood pressure 105/60, pulse 97, temperature 99 F (37.2 C), temperature source Oral, height 5\' 7"  (1.702 m), weight 78.5 kg, last menstrual period 08/25/2018, SpO2 98 %.  Physical Exam  Nursing note and vitals reviewed. Constitutional: She is oriented to person, place, and time. She appears well-developed and well-nourished. No distress.  HENT:  Head: Normocephalic and atraumatic.  Cardiovascular: Normal rate.  Respiratory: Effort normal. No respiratory distress.  GI: Soft. She exhibits no distension. There is no abdominal tenderness.  gravid  Genitourinary:    Genitourinary Comments: VE: closed/long   Musculoskeletal:        General: Normal range of motion.     Cervical back: Normal range of motion.  Neurological: She is alert and oriented to person, place, and time.  Skin: Skin is warm and dry.  Psychiatric: She has a normal mood and affect.  EFM: 135 bpm, mod variability, + accels, no decels Toco: none  Results for orders placed or performed during the hospital encounter of 03/25/19 (from the past 24  hour(s))  Wet prep, genital     Status: Abnormal   Collection Time: 03/25/19  9:02 AM   Specimen: Cervix  Result Value Ref Range   Yeast Wet Prep HPF POC NONE SEEN NONE SEEN   Trich, Wet Prep NONE SEEN NONE SEEN   Clue Cells Wet Prep HPF POC PRESENT (A) NONE SEEN   WBC, Wet Prep HPF POC MANY (A) NONE SEEN   Sperm NONE SEEN   Urinalysis, Routine w reflex microscopic     Status: Abnormal   Collection Time: 03/25/19  9:03 AM  Result Value Ref Range   Color, Urine YELLOW YELLOW   APPearance HAZY (A) CLEAR   Specific Gravity, Urine 1.019 1.005 - 1.030   pH 7.0 5.0 - 8.0   Glucose, UA NEGATIVE NEGATIVE mg/dL   Hgb urine dipstick NEGATIVE NEGATIVE   Bilirubin Urine NEGATIVE NEGATIVE   Ketones, ur 20 (A) NEGATIVE mg/dL   Protein,  ur NEGATIVE NEGATIVE mg/dL   Nitrite NEGATIVE NEGATIVE   Leukocytes,Ua TRACE (A) NEGATIVE   RBC / HPF 0-5 0 - 5 RBC/hpf   WBC, UA 6-10 0 - 5 WBC/hpf   Bacteria, UA RARE (A) NONE SEEN   Squamous Epithelial / LPF 11-20 0 - 5   Mucus PRESENT    MAU Course  Procedures Po hydration  MDM Labs ordered and reviewed. No evidence of PTL or UTI. Suspect dehydration may be contributing to sx. Recommend 5-6 bottles of water daily. Stable for discharge home.   Assessment and Plan   1. [redacted] weeks gestation of pregnancy   2. NST (non-stress test) reactive   3. Abdominal cramping affecting pregnancy    Discharge home Follow up at [facility] this week as scheduled PTL precautions Tylenol prn  Allergies as of 03/25/2019   No Known Allergies     Medication List    TAKE these medications   metoCLOPramide 10 MG tablet Commonly known as: REGLAN Take 1 tablet (10 mg total) by mouth 3 (three) times daily before meals.   prenatal multivitamin Tabs tablet Take 1 tablet by mouth daily at 12 noon.   promethazine 25 MG tablet Commonly known as: PHENERGAN Take 0.5-1 tablets (12.5-25 mg total) by mouth every 6 (six) hours as needed for nausea.      Donette Larry, CNM 03/25/2019, 9:59 AM

## 2019-03-25 NOTE — MAU Note (Signed)
Savannah Carr is a 24 y.o. at [redacted]w[redacted]d here in MAU reporting: started cramping yesterday, got really bad last night. Sometimes it feels tight. States it hurts to touch her abdomen while the pain is happening. Pain is intermittent and occurs every 15-20 minutes. Has not tried any medication at home. No vaginal bleeding, LOF, or abnormal discharge. Has felt fetal movement but states it has been "slow".   Onset of complaint: yesterday  Pain score: 7/10  Vitals:   03/25/19 0820  BP: 105/60  Pulse: 97  Temp: 99 F (37.2 C)  SpO2: 98%     FHT: 143  Lab orders placed from triage: UA

## 2019-03-25 NOTE — Discharge Instructions (Signed)
Braxton Hicks Contractions °Contractions of the uterus can occur throughout pregnancy, but they are not always a sign that you are in labor. You may have practice contractions called Braxton Hicks contractions. These false labor contractions are sometimes confused with true labor. °What are Braxton Hicks contractions? °Braxton Hicks contractions are tightening movements that occur in the muscles of the uterus before labor. Unlike true labor contractions, these contractions do not result in opening (dilation) and thinning of the cervix. Toward the end of pregnancy (32-34 weeks), Braxton Hicks contractions can happen more often and may become stronger. These contractions are sometimes difficult to tell apart from true labor because they can be very uncomfortable. You should not feel embarrassed if you go to the hospital with false labor. °Sometimes, the only way to tell if you are in true labor is for your health care provider to look for changes in the cervix. The health care provider will do a physical exam and may monitor your contractions. If you are not in true labor, the exam should show that your cervix is not dilating and your water has not broken. °If there are no other health problems associated with your pregnancy, it is completely safe for you to be sent home with false labor. You may continue to have Braxton Hicks contractions until you go into true labor. °How to tell the difference between true labor and false labor °True labor °· Contractions last 30-70 seconds. °· Contractions become very regular. °· Discomfort is usually felt in the top of the uterus, and it spreads to the lower abdomen and low back. °· Contractions do not go away with walking. °· Contractions usually become more intense and increase in frequency. °· The cervix dilates and gets thinner. °False labor °· Contractions are usually shorter and not as strong as true labor contractions. °· Contractions are usually irregular. °· Contractions  are often felt in the front of the lower abdomen and in the groin. °· Contractions may go away when you walk around or change positions while lying down. °· Contractions get weaker and are shorter-lasting as time goes on. °· The cervix usually does not dilate or become thin. °Follow these instructions at home: ° °· Take over-the-counter and prescription medicines only as told by your health care provider. °· Keep up with your usual exercises and follow other instructions from your health care provider. °· Eat and drink lightly if you think you are going into labor. °· If Braxton Hicks contractions are making you uncomfortable: °? Change your position from lying down or resting to walking, or change from walking to resting. °? Sit and rest in a tub of warm water. °? Drink enough fluid to keep your urine pale yellow. Dehydration may cause these contractions. °? Do slow and deep breathing several times an hour. °· Keep all follow-up prenatal visits as told by your health care provider. This is important. °Contact a health care provider if: °· You have a fever. °· You have continuous pain in your abdomen. °Get help right away if: °· Your contractions become stronger, more regular, and closer together. °· You have fluid leaking or gushing from your vagina. °· You pass blood-tinged mucus (bloody show). °· You have bleeding from your vagina. °· You have low back pain that you never had before. °· You feel your baby’s head pushing down and causing pelvic pressure. °· Your baby is not moving inside you as much as it used to. °Summary °· Contractions that occur before labor are   called Braxton Hicks contractions, false labor, or practice contractions. °· Braxton Hicks contractions are usually shorter, weaker, farther apart, and less regular than true labor contractions. True labor contractions usually become progressively stronger and regular, and they become more frequent. °· Manage discomfort from Braxton Hicks contractions  by changing position, resting in a warm bath, drinking plenty of water, or practicing deep breathing. °This information is not intended to replace advice given to you by your health care provider. Make sure you discuss any questions you have with your health care provider. °Document Revised: 12/23/2016 Document Reviewed: 05/26/2016 °Elsevier Patient Education © 2020 [employer] Inc[employer] ° °

## 2019-03-26 LAB — GC/CHLAMYDIA PROBE AMP (~~LOC~~) NOT AT ARMC
Chlamydia: NEGATIVE
Comment: NEGATIVE
Comment: NORMAL
Neisseria Gonorrhea: NEGATIVE

## 2019-04-27 ENCOUNTER — Ambulatory Visit: Payer: Medicaid Other | Attending: Internal Medicine

## 2019-04-27 DIAGNOSIS — Z23 Encounter for immunization: Secondary | ICD-10-CM

## 2019-04-27 NOTE — Progress Notes (Signed)
   Covid-19 Vaccination Clinic  Name:  Savannah Carr    MRN: [id number] DOB: [birth year]  04/27/2019  [name] was observed post Covid-19 immunization for 15 minutes without incident. She was provided with Vaccine Information Sheet and instruction to access the V-Safe system.   [name] was instructed to call 911 with any severe reactions post vaccine: Marland Kitchen Difficulty breathing  . Swelling of face and throat  . A fast heartbeat  . A bad rash all over body  . Dizziness and weakness   Immunizations Administered    Name Date Dose VIS Date Route   Pfizer COVID-19 Vaccine 04/27/2019 11:20 AM 0.3 mL 01/04/2019 Intramuscular   Manufacturer: [employer], [employer]   Lot: [id number]   NDC: [address]-2

## 2019-05-17 ENCOUNTER — Encounter (HOSPITAL_COMMUNITY): Payer: Self-pay

## 2019-05-17 NOTE — Patient Instructions (Signed)
Savannah Carr  05/17/2019   Your procedure is scheduled on:  05/27/2019  Arrive at 0530 at Entrance C on [employer] at [facility]  and [employer]. You are invited to use the FREE valet parking or use the Visitor's parking deck.  Pick up the phone at the desk and dial [phone].  Call this number if you have problems the morning of surgery: [phone]  Remember:   Do not eat food:(After Midnight) Desps de medianoche.  Do not drink clear liquids: (After Midnight) Desps de medianoche.  Take these medicines the morning of surgery with A SIP OF WATER:  none   Do not wear jewelry, make-up or nail polish.  Do not wear lotions, powders, or perfumes. Do not wear deodorant.  Do not shave 48 hours prior to surgery.  Do not bring valuables to the hospital.  [facility] is not   responsible for any belongings or valuables brought to the hospital.  Contacts, dentures or bridgework may not be worn into surgery.  Leave suitcase in the car. After surgery it may be brought to your room.  For patients admitted to the hospital, checkout time is 11:00 AM the day of              discharge.      Please read over the following fact sheets that you were given:     Preparing for Surgery

## 2019-05-20 ENCOUNTER — Encounter (HOSPITAL_COMMUNITY): Payer: Self-pay

## 2019-05-21 ENCOUNTER — Ambulatory Visit: Payer: Medicaid Other

## 2019-05-25 ENCOUNTER — Other Ambulatory Visit: Payer: Self-pay | Admitting: Obstetrics and Gynecology

## 2019-05-25 ENCOUNTER — Other Ambulatory Visit (HOSPITAL_COMMUNITY)
Admission: RE | Admit: 2019-05-25 | Discharge: 2019-05-25 | Disposition: A | Discharge status: Home / Self Care | Payer: 59 | Source: Ambulatory Visit | Attending: Obstetrics and Gynecology | Admitting: Obstetrics and Gynecology

## 2019-05-25 ENCOUNTER — Other Ambulatory Visit: Payer: Self-pay

## 2019-05-25 DIAGNOSIS — Z01812 Encounter for preprocedural laboratory examination: Secondary | ICD-10-CM | POA: Insufficient documentation

## 2019-05-25 DIAGNOSIS — Z20822 Contact with and (suspected) exposure to covid-19: Secondary | ICD-10-CM | POA: Insufficient documentation

## 2019-05-25 LAB — RPR: RPR Ser Ql: NONREACTIVE

## 2019-05-25 LAB — TYPE AND SCREEN
ABO/RH(D): B POS
Antibody Screen: NEGATIVE

## 2019-05-25 LAB — CBC
HCT: 32.4 % — ABNORMAL LOW (ref 36.0–46.0)
Hemoglobin: 10.9 g/dL — ABNORMAL LOW (ref 12.0–15.0)
MCH: 31.9 pg (ref 26.0–34.0)
MCHC: 33.6 g/dL (ref 30.0–36.0)
MCV: 94.7 fL (ref 80.0–100.0)
Platelets: 373 10*3/uL (ref 150–400)
RBC: 3.42 MIL/uL — ABNORMAL LOW (ref 3.87–5.11)
RDW: 12.7 % (ref 11.5–15.5)
WBC: 10.3 10*3/uL (ref 4.0–10.5)
nRBC: 0 % (ref 0.0–0.2)

## 2019-05-25 LAB — ABO/RH: ABO/RH(D): B POS

## 2019-05-25 LAB — SARS CORONAVIRUS 2 (TAT 6-24 HRS): SARS Coronavirus 2: NEGATIVE

## 2019-05-25 NOTE — H&P (Signed)
Savannah Carr is a 24 y.o. female G1P0 at 62 wks and 2 days  presenting for primary cesarean section due to breech presentation. Prenatal Care has been otherwise uncomplicated. Prenatal care provided by Dr. Gerald Leitz with [facility].   OB History    Gravida  1   Para      Term      Preterm      AB      Living        SAB      TAB      Ectopic      Multiple      Live Births             Past Medical History:  Diagnosis Date  . Medical history non-contributory    Past Surgical History:  Procedure Laterality Date  . NO PAST SURGERIES     Family History: family history includes Cancer in her paternal grandfather; Hypertension in her maternal grandfather and mother. Social History:  reports that she has never smoked. She has never used smokeless tobacco. She reports that she does not drink alcohol or use drugs.     Maternal Diabetes: No Genetic Screening: Normal Maternal Ultrasounds/Referrals: Normal Fetal Ultrasounds or other Referrals:  None Maternal Substance Abuse:  No Significant Maternal Medications:  None Significant Maternal Lab Results:  Group B Strep negative Other Comments:  None  Review of Systems  Constitutional: Negative.   HENT: Negative.   Eyes: Negative.   Respiratory: Negative.   Cardiovascular: Negative.   Gastrointestinal: Negative.   Endocrine: Negative.   Genitourinary: Negative.   Musculoskeletal: Negative.   Skin: Negative.   Allergic/Immunologic: Negative.   Neurological: Negative.   Hematological: Negative.   Psychiatric/Behavioral: Negative.    History   Last menstrual period 08/25/2018. Exam Physical Exam  Vitals reviewed. Constitutional: She is oriented to person, place, and time. She appears well-developed and well-nourished.  HENT:  Head: Normocephalic and atraumatic.  Eyes: Pupils are equal, round, and reactive to light. Conjunctivae are normal.  Cardiovascular: Normal rate and regular rhythm.   Respiratory: Effort normal and breath sounds normal.  GI: There is no abdominal tenderness.  Genitourinary:    Vagina normal.   Musculoskeletal:        General: No edema. Normal range of motion.     Cervical back: Normal range of motion and neck supple.  Neurological: She is alert and oriented to person, place, and time. She has normal reflexes.  Skin: Skin is warm and dry.  Psychiatric: She has a normal mood and affect.    Prenatal labs: ABO, Rh: --/--/B POS, B POS Performed at [facility], [address] [place], [place] [address]  [phone]) Antibody: NEG (05/01 6195) Rubella: Immune (12/10 0000) RPR: NON REACTIVE (05/01 0903)  HBsAg: Negative (12/10 0000)  HIV: Non-reactive (12/10 0000)  GBS:   Negative  Assessment/Plan: 39 wks and 2 days with breech presentation.  Pt declined ECV and desires primary cesarean section. R/B/A of cesarean section discussed with the patient including but not limited to infection, bleeding, damage to bowel, bladder and Carr with the need for further surgery. Pt voiced understanding and desires to proceed.    Gerald Leitz 05/25/2019, 11:36 AM

## 2019-05-25 NOTE — MAU Note (Signed)
Pt here for PAT covid swab and lab draw. Denies symptoms or sick contacts. Swab collected. 

## 2019-05-25 NOTE — H&P (Deleted)
  The note originally documented on this encounter has been moved the the encounter in which it belongs.  

## 2019-05-26 NOTE — Anesthesia Preprocedure Evaluation (Addendum)
Anesthesia Evaluation  Patient identified by MRN, date of birth, ID band Patient awake    Reviewed: Allergy & Precautions, NPO status , Patient's Chart, lab work & pertinent test results  History of Anesthesia Complications Negative for: history of anesthetic complications  Airway Mallampati: II  TM Distance: >3 FB Neck ROM: Full    Dental no notable dental hx.    Pulmonary neg pulmonary ROS,    Pulmonary exam normal        Cardiovascular negative cardio ROS Normal cardiovascular exam     Neuro/Psych negative neurological ROS  negative psych ROS   GI/Hepatic negative GI ROS, Neg liver ROS,   Endo/Other  negative endocrine ROS  Renal/GU negative Renal ROS  negative genitourinary   Musculoskeletal negative musculoskeletal ROS (+)   Abdominal   Peds  Hematology Hgb 10.9, Plt 373   Anesthesia Other Findings Day of surgery medications reviewed with patient.  Reproductive/Obstetrics (+) Pregnancy (breech presentation)                            Anesthesia Physical Anesthesia Plan  ASA: II  Anesthesia Plan: Spinal   Post-op Pain Management:    Induction:   PONV Risk Score and Plan: 4 or greater and Treatment may vary due to age or medical condition, Ondansetron and Dexamethasone  Airway Management Planned: Natural Airway  Additional Equipment: None  Intra-op Plan:   Post-operative Plan:   Informed Consent: I have reviewed the patients History and Physical, chart, labs and discussed the procedure including the risks, benefits and alternatives for the proposed anesthesia with the patient or authorized representative who has indicated his/her understanding and acceptance.       Plan Discussed with: CRNA  Anesthesia Plan Comments:        Anesthesia Quick Evaluation

## 2019-05-27 ENCOUNTER — Inpatient Hospital Stay (HOSPITAL_COMMUNITY)
Admission: RE | Admit: 2019-05-27 | Discharge: 2019-05-29 | DRG: 788 | Disposition: A | Discharge status: Home / Self Care | Payer: 59 | Attending: Obstetrics and Gynecology | Admitting: Obstetrics and Gynecology

## 2019-05-27 ENCOUNTER — Encounter (HOSPITAL_COMMUNITY)
Admission: RE | Disposition: A | Discharge status: Home / Self Care | Payer: Self-pay | Source: Home / Self Care | Attending: Obstetrics and Gynecology

## 2019-05-27 ENCOUNTER — Encounter (HOSPITAL_COMMUNITY): Payer: Self-pay | Admitting: Obstetrics and Gynecology

## 2019-05-27 ENCOUNTER — Other Ambulatory Visit: Payer: Self-pay

## 2019-05-27 ENCOUNTER — Inpatient Hospital Stay (HOSPITAL_COMMUNITY): Payer: 59 | Admitting: Anesthesiology

## 2019-05-27 DIAGNOSIS — Z3A39 39 weeks gestation of pregnancy: Secondary | ICD-10-CM | POA: Diagnosis not present

## 2019-05-27 DIAGNOSIS — Z20822 Contact with and (suspected) exposure to covid-19: Secondary | ICD-10-CM | POA: Diagnosis present

## 2019-05-27 DIAGNOSIS — Z98891 History of uterine scar from previous surgery: Secondary | ICD-10-CM

## 2019-05-27 DIAGNOSIS — O321XX0 Maternal care for breech presentation, not applicable or unspecified: Secondary | ICD-10-CM | POA: Diagnosis present

## 2019-05-27 SURGERY — Surgical Case
Anesthesia: Spinal

## 2019-05-27 MED ORDER — ZOLPIDEM TARTRATE 5 MG PO TABS: 5.0000 mg | ORAL_TABLET | Freq: Every evening | ORAL | Status: DC | PRN

## 2019-05-27 MED ORDER — ONDANSETRON HCL 4 MG/2ML IJ SOLN
INTRAMUSCULAR | Status: DC | PRN
  Administered 2019-05-27: 4 mg via INTRAVENOUS

## 2019-05-27 MED ORDER — PHENYLEPHRINE HCL (PRESSORS) 10 MG/ML IV SOLN
INTRAVENOUS | Status: DC | PRN
  Administered 2019-05-27: 80 ug via INTRAVENOUS

## 2019-05-27 MED ORDER — BUPIVACAINE IN DEXTROSE 0.75-8.25 % IT SOLN
INTRATHECAL | Status: DC | PRN
  Administered 2019-05-27: 1.6 mL via INTRATHECAL

## 2019-05-27 MED ORDER — SIMETHICONE 80 MG PO CHEW: 80.0000 mg | CHEWABLE_TABLET | ORAL | Status: DC | PRN

## 2019-05-27 MED ORDER — ACETAMINOPHEN 500 MG PO TABS: 1000.0000 mg | ORAL_TABLET | Freq: Once | ORAL | Status: DC

## 2019-05-27 MED ORDER — SOD CITRATE-CITRIC ACID 500-334 MG/5ML PO SOLN
30.0000 mL | ORAL | Status: AC
  Administered 2019-05-27: 30 mL via ORAL

## 2019-05-27 MED ORDER — DEXAMETHASONE SODIUM PHOSPHATE 4 MG/ML IJ SOLN
INTRAMUSCULAR | Status: DC | PRN
  Administered 2019-05-27: 4 mg via INTRAVENOUS

## 2019-05-27 MED ORDER — OXYTOCIN 40 UNITS IN NORMAL SALINE INFUSION - SIMPLE MED
INTRAVENOUS | Status: AC
  Filled 2019-05-27: qty 1000

## 2019-05-27 MED ORDER — NALBUPHINE HCL 10 MG/ML IJ SOLN: 5.0000 mg | Freq: Once | INTRAMUSCULAR | Status: DC | PRN

## 2019-05-27 MED ORDER — ONDANSETRON HCL 4 MG/2ML IJ SOLN: 4.0000 mg | Freq: Three times a day (TID) | INTRAMUSCULAR | Status: DC | PRN

## 2019-05-27 MED ORDER — PRENATAL MULTIVITAMIN CH
1.0000 | ORAL_TABLET | Freq: Every day | ORAL | Status: DC
  Administered 2019-05-27 – 2019-05-29 (×3): 1 via ORAL
  Filled 2019-05-27 (×3): qty 1

## 2019-05-27 MED ORDER — MENTHOL 3 MG MT LOZG: 1.0000 | LOZENGE | OROMUCOSAL | Status: DC | PRN

## 2019-05-27 MED ORDER — PHENYLEPHRINE HCL-NACL 20-0.9 MG/250ML-% IV SOLN
INTRAVENOUS | Status: DC | PRN
  Administered 2019-05-27: 60 ug/min via INTRAVENOUS

## 2019-05-27 MED ORDER — SOD CITRATE-CITRIC ACID 500-334 MG/5ML PO SOLN
ORAL | Status: AC
  Filled 2019-05-27: qty 30

## 2019-05-27 MED ORDER — NALOXONE HCL 0.4 MG/ML IJ SOLN: 0.4000 mg | INTRAMUSCULAR | Status: DC | PRN

## 2019-05-27 MED ORDER — IBUPROFEN 800 MG PO TABS
800.0000 mg | ORAL_TABLET | Freq: Three times a day (TID) | ORAL | Status: DC
  Administered 2019-05-27 – 2019-05-29 (×7): 800 mg via ORAL
  Filled 2019-05-27: qty 4
  Filled 2019-05-27 (×6): qty 1

## 2019-05-27 MED ORDER — DIPHENHYDRAMINE HCL 25 MG PO CAPS: 25.0000 mg | ORAL_CAPSULE | Freq: Four times a day (QID) | ORAL | Status: DC | PRN

## 2019-05-27 MED ORDER — CEFAZOLIN SODIUM-DEXTROSE 2-4 GM/100ML-% IV SOLN
INTRAVENOUS | Status: AC
  Filled 2019-05-27: qty 100

## 2019-05-27 MED ORDER — SIMETHICONE 80 MG PO CHEW
80.0000 mg | CHEWABLE_TABLET | Freq: Three times a day (TID) | ORAL | Status: DC
  Administered 2019-05-27 – 2019-05-29 (×7): 80 mg via ORAL
  Filled 2019-05-27 (×7): qty 1

## 2019-05-27 MED ORDER — NALOXONE HCL 4 MG/10ML IJ SOLN
1.0000 ug/kg/h | INTRAVENOUS | Status: DC | PRN
  Filled 2019-05-27: qty 5

## 2019-05-27 MED ORDER — COCONUT OIL OIL: 1.0000 "application " | TOPICAL_OIL | Status: DC | PRN

## 2019-05-27 MED ORDER — LACTATED RINGERS IV SOLN: INTRAVENOUS | Status: DC | PRN

## 2019-05-27 MED ORDER — DIPHENHYDRAMINE HCL 25 MG PO CAPS: 25.0000 mg | ORAL_CAPSULE | ORAL | Status: DC | PRN

## 2019-05-27 MED ORDER — HYDROMORPHONE HCL 1 MG/ML IJ SOLN: 0.2000 mg | INTRAMUSCULAR | Status: DC | PRN

## 2019-05-27 MED ORDER — DEXAMETHASONE SODIUM PHOSPHATE 4 MG/ML IJ SOLN
INTRAMUSCULAR | Status: AC
  Filled 2019-05-27: qty 7

## 2019-05-27 MED ORDER — FENTANYL CITRATE (PF) 100 MCG/2ML IJ SOLN
25.0000 ug | INTRAMUSCULAR | Status: DC | PRN
  Administered 2019-05-27: 25 ug via INTRAVENOUS
  Administered 2019-05-27: 50 ug via INTRAVENOUS
  Administered 2019-05-27: 25 ug via INTRAVENOUS

## 2019-05-27 MED ORDER — FENTANYL CITRATE (PF) 100 MCG/2ML IJ SOLN
INTRAMUSCULAR | Status: AC
  Filled 2019-05-27: qty 2

## 2019-05-27 MED ORDER — KETOROLAC TROMETHAMINE 30 MG/ML IJ SOLN
30.0000 mg | Freq: Once | INTRAMUSCULAR | Status: AC
  Administered 2019-05-27: 30 mg via INTRAVENOUS

## 2019-05-27 MED ORDER — KETOROLAC TROMETHAMINE 30 MG/ML IJ SOLN
INTRAMUSCULAR | Status: AC
  Filled 2019-05-27: qty 1

## 2019-05-27 MED ORDER — SIMETHICONE 80 MG PO CHEW
80.0000 mg | CHEWABLE_TABLET | ORAL | Status: DC
  Administered 2019-05-27 – 2019-05-28 (×2): 80 mg via ORAL
  Filled 2019-05-27 (×3): qty 1

## 2019-05-27 MED ORDER — WITCH HAZEL-GLYCERIN EX PADS: 1.0000 "application " | MEDICATED_PAD | CUTANEOUS | Status: DC | PRN

## 2019-05-27 MED ORDER — DIBUCAINE (PERIANAL) 1 % EX OINT: 1.0000 "application " | TOPICAL_OINTMENT | CUTANEOUS | Status: DC | PRN

## 2019-05-27 MED ORDER — ACETAMINOPHEN 500 MG PO TABS: 1000.0000 mg | ORAL_TABLET | Freq: Four times a day (QID) | ORAL | Status: DC

## 2019-05-27 MED ORDER — SODIUM CHLORIDE 0.9% FLUSH: 3.0000 mL | INTRAVENOUS | Status: DC | PRN

## 2019-05-27 MED ORDER — CEFAZOLIN SODIUM-DEXTROSE 2-4 GM/100ML-% IV SOLN: 2.0000 g | INTRAVENOUS | Status: DC

## 2019-05-27 MED ORDER — KETOROLAC TROMETHAMINE 30 MG/ML IJ SOLN: 30.0000 mg | Freq: Four times a day (QID) | INTRAMUSCULAR | Status: AC | PRN

## 2019-05-27 MED ORDER — DIPHENHYDRAMINE HCL 50 MG/ML IJ SOLN: 12.5000 mg | INTRAMUSCULAR | Status: DC | PRN

## 2019-05-27 MED ORDER — METHYLERGONOVINE MALEATE 0.2 MG PO TABS: 0.2000 mg | ORAL_TABLET | ORAL | Status: DC | PRN

## 2019-05-27 MED ORDER — LACTATED RINGERS IV SOLN: INTRAVENOUS | Status: DC

## 2019-05-27 MED ORDER — PROMETHAZINE HCL 25 MG/ML IJ SOLN: 6.2500 mg | INTRAMUSCULAR | Status: DC | PRN

## 2019-05-27 MED ORDER — OXYTOCIN 10 UNIT/ML IJ SOLN
INTRAMUSCULAR | Status: DC | PRN
  Administered 2019-05-27: 40 [IU]

## 2019-05-27 MED ORDER — PHENYLEPHRINE 40 MCG/ML (10ML) SYRINGE FOR IV PUSH (FOR BLOOD PRESSURE SUPPORT)
PREFILLED_SYRINGE | INTRAVENOUS | Status: AC
  Filled 2019-05-27: qty 10

## 2019-05-27 MED ORDER — STERILE WATER FOR IRRIGATION IR SOLN
Status: DC | PRN
  Administered 2019-05-27: 1000 mL

## 2019-05-27 MED ORDER — ACETAMINOPHEN 500 MG PO TABS
1000.0000 mg | ORAL_TABLET | Freq: Four times a day (QID) | ORAL | Status: DC
  Administered 2019-05-27 – 2019-05-29 (×8): 1000 mg via ORAL
  Filled 2019-05-27 (×8): qty 2

## 2019-05-27 MED ORDER — METOCLOPRAMIDE HCL 5 MG/ML IJ SOLN
INTRAMUSCULAR | Status: DC | PRN
  Administered 2019-05-27: 10 mg via INTRAVENOUS

## 2019-05-27 MED ORDER — PHENYLEPHRINE HCL-NACL 20-0.9 MG/250ML-% IV SOLN
INTRAVENOUS | Status: AC
  Filled 2019-05-27: qty 250

## 2019-05-27 MED ORDER — METOCLOPRAMIDE HCL 5 MG/ML IJ SOLN
INTRAMUSCULAR | Status: AC
  Filled 2019-05-27: qty 2

## 2019-05-27 MED ORDER — SENNOSIDES-DOCUSATE SODIUM 8.6-50 MG PO TABS
2.0000 | ORAL_TABLET | ORAL | Status: DC
  Administered 2019-05-27 – 2019-05-28 (×2): 2 via ORAL
  Filled 2019-05-27 (×2): qty 2

## 2019-05-27 MED ORDER — CEFAZOLIN SODIUM-DEXTROSE 2-3 GM-%(50ML) IV SOLR
INTRAVENOUS | Status: DC | PRN
  Administered 2019-05-27: 2 g via INTRAVENOUS

## 2019-05-27 MED ORDER — MORPHINE SULFATE (PF) 0.5 MG/ML IJ SOLN
INTRAMUSCULAR | Status: AC
  Filled 2019-05-27: qty 10

## 2019-05-27 MED ORDER — METHYLERGONOVINE MALEATE 0.2 MG/ML IJ SOLN: 0.2000 mg | INTRAMUSCULAR | Status: DC | PRN

## 2019-05-27 MED ORDER — OXYTOCIN 40 UNITS IN NORMAL SALINE INFUSION - SIMPLE MED
2.5000 [IU]/h | INTRAVENOUS | Status: AC
  Administered 2019-05-27: 2.5 [IU]/h via INTRAVENOUS

## 2019-05-27 MED ORDER — NALBUPHINE HCL 10 MG/ML IJ SOLN: 5.0000 mg | INTRAMUSCULAR | Status: DC | PRN

## 2019-05-27 MED ORDER — SCOPOLAMINE 1 MG/3DAYS TD PT72
MEDICATED_PATCH | TRANSDERMAL | Status: AC
  Filled 2019-05-27: qty 1

## 2019-05-27 MED ORDER — FERROUS SULFATE 325 (65 FE) MG PO TABS
325.0000 mg | ORAL_TABLET | Freq: Two times a day (BID) | ORAL | Status: DC
  Administered 2019-05-27 – 2019-05-29 (×4): 325 mg via ORAL
  Filled 2019-05-27 (×4): qty 1

## 2019-05-27 MED ORDER — FENTANYL CITRATE (PF) 100 MCG/2ML IJ SOLN
INTRAMUSCULAR | Status: DC | PRN
  Administered 2019-05-27: 85 ug via INTRATHECAL
  Administered 2019-05-27: 15 ug via INTRATHECAL

## 2019-05-27 MED ORDER — ONDANSETRON HCL 4 MG/2ML IJ SOLN
INTRAMUSCULAR | Status: AC
  Filled 2019-05-27: qty 2

## 2019-05-27 MED ORDER — SODIUM CHLORIDE 0.9 % IV SOLN: INTRAVENOUS | Status: DC | PRN

## 2019-05-27 MED ORDER — MORPHINE SULFATE (PF) 0.5 MG/ML IJ SOLN
INTRAMUSCULAR | Status: DC | PRN
  Administered 2019-05-27: 150 ug via INTRATHECAL

## 2019-05-27 MED ORDER — OXYCODONE HCL 5 MG PO TABS
5.0000 mg | ORAL_TABLET | ORAL | Status: DC | PRN
  Administered 2019-05-29: 5 mg via ORAL
  Filled 2019-05-27: qty 1

## 2019-05-27 MED ORDER — ACETAMINOPHEN 160 MG/5ML PO SOLN: 1000.0000 mg | Freq: Once | ORAL | Status: DC

## 2019-05-27 SURGICAL SUPPLY — 39 items
BARRIER ADHS 3X4 INTERCEED (GAUZE/BANDAGES/DRESSINGS) ×2 IMPLANT
BENZOIN TINCTURE PRP APPL 2/3 (GAUZE/BANDAGES/DRESSINGS) ×3 IMPLANT
CHLORAPREP W/TINT 26ML (MISCELLANEOUS) ×3 IMPLANT
CLAMP CORD UMBIL (MISCELLANEOUS) IMPLANT
CLOSURE STERI-STRIP 1/2X4 (GAUZE/BANDAGES/DRESSINGS) ×1
CLOSURE WOUND 1/2 X4 (GAUZE/BANDAGES/DRESSINGS) ×1
CLOTH BEACON ORANGE TIMEOUT ST (SAFETY) ×3 IMPLANT
CLSR STERI-STRIP ANTIMIC 1/2X4 (GAUZE/BANDAGES/DRESSINGS) ×1 IMPLANT
DRSG OPSITE POSTOP 4X10 (GAUZE/BANDAGES/DRESSINGS) ×3 IMPLANT
ELECT REM PT RETURN 9FT ADLT (ELECTROSURGICAL) ×3
ELECTRODE REM PT RTRN 9FT ADLT (ELECTROSURGICAL) ×1 IMPLANT
EXTRACTOR VACUUM KIWI (MISCELLANEOUS) IMPLANT
GLOVE BIOGEL M 7.0 STRL (GLOVE) ×6 IMPLANT
GLOVE BIOGEL PI IND STRL 7.0 (GLOVE) ×3 IMPLANT
GLOVE BIOGEL PI INDICATOR 7.0 (GLOVE) ×6
GOWN STRL REUS W/TWL LRG LVL3 (GOWN DISPOSABLE) ×9 IMPLANT
KIT ABG SYR 3ML LUER SLIP (SYRINGE) IMPLANT
NDL HYPO 25X5/8 SAFETYGLIDE (NEEDLE) IMPLANT
NEEDLE HYPO 25X5/8 SAFETYGLIDE (NEEDLE) IMPLANT
NS IRRIG 1000ML POUR BTL (IV SOLUTION) ×3 IMPLANT
PACK C SECTION WH (CUSTOM PROCEDURE TRAY) ×3 IMPLANT
PAD OB MATERNITY 4.3X12.25 (PERSONAL CARE ITEMS) ×3 IMPLANT
PENCIL SMOKE EVAC W/HOLSTER (ELECTROSURGICAL) ×3 IMPLANT
RTRCTR C-SECT PINK 25CM LRG (MISCELLANEOUS) IMPLANT
STRIP CLOSURE SKIN 1/2X4 (GAUZE/BANDAGES/DRESSINGS) ×2 IMPLANT
SUT PDS AB 0 CT1 27 (SUTURE) ×6 IMPLANT
SUT PLAIN 0 NONE (SUTURE) IMPLANT
SUT VIC AB 0 CTX 36 (SUTURE) ×9
SUT VIC AB 0 CTX36XBRD ANBCTRL (SUTURE) ×3 IMPLANT
SUT VIC AB 2-0 CT1 27 (SUTURE) ×6
SUT VIC AB 2-0 CT1 TAPERPNT 27 (SUTURE) ×1 IMPLANT
SUT VIC AB 2-0 SH 27 (SUTURE) ×3
SUT VIC AB 2-0 SH 27XBRD (SUTURE) IMPLANT
SUT VIC AB 3-0 SH 27 (SUTURE)
SUT VIC AB 3-0 SH 27X BRD (SUTURE) IMPLANT
SUT VIC AB 4-0 KS 27 (SUTURE) ×3 IMPLANT
TOWEL OR 17X24 6PK STRL BLUE (TOWEL DISPOSABLE) ×3 IMPLANT
TRAY FOLEY W/BAG SLVR 14FR LF (SET/KITS/TRAYS/PACK) ×3 IMPLANT
WATER STERILE IRR 1000ML POUR (IV SOLUTION) ×3 IMPLANT

## 2019-05-27 NOTE — H&P (Signed)
Date of Initial H&P: 05/25/2019  History reviewed, patient examined, no change in status, stable for surgery.

## 2019-05-27 NOTE — Anesthesia Procedure Notes (Signed)
Spinal  Patient location during procedure: OR Start time: 05/27/2019 7:33 AM End time: 05/27/2019 7:36 AM Staffing Performed: anesthesiologist  Anesthesiologist: Kaylyn Layer, MD Preanesthetic Checklist Completed: patient identified, IV checked, risks and benefits discussed, monitors and equipment checked, pre-op evaluation and timeout performed Spinal Block Patient position: sitting Prep: DuraPrep and site prepped and draped Patient monitoring: heart rate, continuous pulse ox and blood pressure Approach: midline Location: L3-4 Injection technique: single-shot Needle Needle type: Pencan  Needle gauge: 24 G Needle length: 10 cm Assessment Sensory level: T4 Additional Notes Risks, benefits, and alternative discussed. Patient gave consent to procedure. Prepped and draped in sitting position. Clear CSF obtained after one needle pass. Positive terminal aspiration. No pain or paraesthesias with injection. Patient tolerated procedure well. Vital signs stable. Amalia Greenhouse, MD

## 2019-05-27 NOTE — Transfer of Care (Signed)
Immediate Anesthesia Transfer of Care Note  Patient: Savannah Carr  Procedure(s) Performed: CESAREAN SECTION-REQUEST RNFA (N/A )  Patient Location: PACU  Anesthesia Type:Spinal  Level of Consciousness: awake, alert  and oriented  Airway & Oxygen Therapy: Patient Spontanous Breathing  Post-op Assessment: Report given to RN and Post -op Vital signs reviewed and stable  Post vital signs: Reviewed and stable  Last Vitals:  Vitals Value Taken Time  BP 111/74 05/27/19 0851  Temp    Pulse 81 05/27/19 0853  Resp 17 05/27/19 0853  SpO2 100 % 05/27/19 0853  Vitals shown include unvalidated device data.  Last Pain:  Vitals:   05/27/19 0625  TempSrc: Oral         Complications: No apparent anesthesia complications

## 2019-05-27 NOTE — Op Note (Signed)
Cesarean Section Procedure Note  Indications: malpresentation: Breech  Pre-operative Diagnosis: 39 week 2 day pregnancy.  Post-operative Diagnosis: same  Surgeon: Gerald Leitz M.D.  Assistants: Marcheta Grammes RNFA  Anesthesia: Spinal anesthesia  ASA Class: 2   Procedure Details   The patient was seen in the Holding Room. The risks, benefits, complications, treatment options, and expected outcomes were discussed with the patient.  The patient concurred with the proposed plan, giving informed consent.  The site of surgery properly noted/marked. The patient was taken to Operating Room # C, identified as Savannah Carr and the procedure verified as C-Section Delivery. A Time Out was held and the above information confirmed.  After induction of anesthesia, the patient was draped and prepped in the usual sterile manner. A Pfannenstiel incision was made and carried down through the subcutaneous tissue to the fascia. Fascial incision was made and extended transversely. The fascia was separated from the underlying rectus tissue superiorly and inferiorly. The peritoneum was identified and entered. Peritoneal incision was extended longitudinally. The utero-vesical peritoneal reflection was incised transversely and the bladder flap was bluntly freed from the lower uterine segment. A low transverse uterine incision was made. Delivered from breech  presentation was a  Female with Apgar scores of 8 at one minute and 9 at five minutes. After the umbilical cord was clamped and cut cord blood was obtained for evaluation. The placenta was removed intact and appeared normal. The uterine outline, tubes and ovaries appeared normal. The uterine incision was closed with running locked sutures of 0 vicryl . A second layer of 0 vicryl was used to imbricate the incision. . Hemostasis was observed. Lavage was carried out until clear. Interseed was placed along the uterine incision. The peritoneum was reapproximated  with 2-0 vicryl . The fascia was then reapproximated with running sutures of 0 pds. . The skin was reapproximated with 4-0 vicryl .  Instrument, sponge, and needle counts were correct prior the abdominal closure and at the conclusion of the case.   Findings: Female infant in the breech presentation. Normal fallopian tubes and ovaries   Estimated Blood Loss:  600 ml         Drains: None         Total IV Fluids:  Per anesthesia ml         Specimens: Placenta  To labor and delivery          Implants: None         Complications:  None; patient tolerated the procedure well.         Disposition: PACU - hemodynamically stable.         Condition: stable  Attending Attestation: I performed the procedure.

## 2019-05-27 NOTE — Anesthesia Postprocedure Evaluation (Signed)
Anesthesia Post Note  Patient: GABBI WHETSTONE  Procedure(s) Performed: CESAREAN SECTION-REQUEST RNFA (N/A )     Patient location during evaluation: PACU Anesthesia Type: Spinal Level of consciousness: awake and alert and oriented Pain management: pain level controlled Vital Signs Assessment: post-procedure vital signs reviewed and stable Respiratory status: spontaneous breathing, nonlabored ventilation and respiratory function stable Cardiovascular status: blood pressure returned to baseline Postop Assessment: no apparent nausea or vomiting Anesthetic complications: no    Last Vitals:  Vitals:   05/27/19 0930 05/27/19 0945  BP: 100/61 119/75  Pulse: 94 85  Resp: (!) 21 18  Temp: 36.6 C   SpO2: 98% 97%    Last Pain:  Vitals:   05/27/19 0945  TempSrc:   PainSc: 3    Pain Goal:    LLE Motor Response: Purposeful movement (05/27/19 0945) LLE Sensation: Tingling (05/27/19 0945) RLE Motor Response: Purposeful movement (05/27/19 0945) RLE Sensation: Tingling (05/27/19 0945)     Epidural/Spinal Function Cutaneous sensation: Tingles (05/27/19 0945), Patient able to flex knees: Yes (05/27/19 0945), Patient able to lift hips off bed: No (05/27/19 0945), Back pain beyond tenderness at insertion site: No (05/27/19 0945), Progressively worsening motor and/or sensory loss: No (05/27/19 0945), Bowel and/or bladder incontinence post epidural: No (05/27/19 0945)  Kaylyn Layer

## 2019-05-28 ENCOUNTER — Encounter: Payer: Self-pay | Admitting: *Deleted

## 2019-05-28 LAB — CBC
HCT: 27.9 % — ABNORMAL LOW (ref 36.0–46.0)
Hemoglobin: 9.4 g/dL — ABNORMAL LOW (ref 12.0–15.0)
MCH: 32.3 pg (ref 26.0–34.0)
MCHC: 33.7 g/dL (ref 30.0–36.0)
MCV: 95.9 fL (ref 80.0–100.0)
Platelets: 332 10*3/uL (ref 150–400)
RBC: 2.91 MIL/uL — ABNORMAL LOW (ref 3.87–5.11)
RDW: 12.8 % (ref 11.5–15.5)
WBC: 18.7 10*3/uL — ABNORMAL HIGH (ref 4.0–10.5)
nRBC: 0 % (ref 0.0–0.2)

## 2019-05-28 NOTE — Progress Notes (Signed)
Subjective: Postpartum Day 1: Cesarean Delivery Patient reports tolerating PO, + flatus and no problems voiding.    Objective: Vital signs in last 24 hours: Temp:  [97.9 F (36.6 C)-98.4 F (36.9 C)] 98.4 F (36.9 C) (05/04 1336) Pulse Rate:  [80-90] 90 (05/04 1336) Resp:  [16-18] 16 (05/04 1336) BP: (99-109)/(61-72) 107/62 (05/04 1336) SpO2:  [100 %] 100 % (05/04 1336) Weight:  [81.6 kg] 81.6 kg (05/04 0753)  Physical Exam:  General: alert, cooperative and no distress Lochia: appropriate Uterine Fundus: firm Incision: serosanguinous drainage on bandage .. no active bleeding  DVT Evaluation: No evidence of DVT seen on physical exam.  Recent Labs    05/28/19 0559  HGB 9.4*  HCT 27.9*    Assessment/Plan: Status post Cesarean section. Doing well postoperatively.  Continue current care R/B/A of cicumcision discussed. Pt desires circumcison of newborn prior to discharge .  Gerald Leitz 05/28/2019, 5:27 PM

## 2019-05-28 NOTE — Plan of Care (Signed)
  Problem: Education: Goal: Knowledge of General Education information will improve Description: Including pain rating scale, medication(s)/side effects and non-pharmacologic comfort measures Outcome: Completed/Met   Problem: Clinical Measurements: Goal: Ability to maintain clinical measurements within normal limits will improve Outcome: Completed/Met Goal: Diagnostic test results will improve Outcome: Completed/Met   Problem: Activity: Goal: Risk for activity intolerance will decrease Outcome: Completed/Met   Problem: Nutrition: Goal: Adequate nutrition will be maintained Outcome: Completed/Met   Problem: Elimination: Goal: Will not experience complications related to bowel motility Outcome: Completed/Met Goal: Will not experience complications related to urinary retention Outcome: Completed/Met   Problem: Pain Managment: Goal: General experience of comfort will improve Outcome: Completed/Met   Problem: Safety: Goal: Ability to remain free from injury will improve Outcome: Completed/Met   Problem: Education: Goal: Knowledge of condition will improve Outcome: Completed/Met   Problem: Activity: Goal: Will verbalize the importance of balancing activity with adequate rest periods Outcome: Completed/Met   Problem: Coping: Goal: Ability to identify and utilize available resources and services will improve Outcome: Completed/Met   Problem: Role Relationship: Goal: Ability to demonstrate positive interaction with newborn will improve Outcome: Completed/Met

## 2019-05-29 MED ORDER — ACETAMINOPHEN 500 MG PO TABS: 1000.0000 mg | ORAL_TABLET | Freq: Four times a day (QID) | ORAL | 0 refills | Status: DC

## 2019-05-29 MED ORDER — IBUPROFEN 800 MG PO TABS: 800.0000 mg | ORAL_TABLET | Freq: Three times a day (TID) | ORAL | 1 refills | Status: DC | PRN

## 2019-05-29 MED ORDER — OXYCODONE HCL 5 MG PO TABS: 5.0000 mg | ORAL_TABLET | ORAL | 0 refills | Status: AC | PRN

## 2019-05-29 NOTE — Discharge Summary (Signed)
OB Discharge Summary     Patient Name: Savannah Carr DOB: 08-25-1995 MRN: [id number]  Date of admission: 05/27/2019 Delivering MD: Christophe Louis   Date of discharge: 05/29/2019  Admitting diagnosis: S/P cesarean section [Z98.891] Intrauterine pregnancy: [redacted]w[redacted]d     Secondary diagnosis:  Active Problems:   S/P cesarean section  Additional problems: None     Discharge diagnosis: Term Pregnancy Delivered                                                                                                Post partum procedures:None  Augmentation: NA  Complications: None  Hospital course:  Sceduled C/S   24 y.o. yo G1P1001 at [redacted]w[redacted]d was admitted to the hospital 05/27/2019 for scheduled cesarean section with the following indication:Malpresentation.  Membrane Rupture Time/Date: 7:59 AM ,05/27/2019   Patient delivered a Viable infant.05/27/2019  Details of operation can be found in separate operative note.  Pateint had an uncomplicated postpartum course.  She is ambulating, tolerating a regular diet, passing flatus, and urinating well. Patient is discharged home in stable condition on  05/29/19         Physical exam  Vitals:   05/28/19 0830 05/28/19 1336 05/28/19 2108 05/29/19 0550  BP: 109/68 107/62 110/74 (!) 115/58  Pulse: 85 90 88 84  Resp: 18 16  17   Temp: 97.9 F (36.6 C) 98.4 F (36.9 C) 97.9 F (36.6 C) 98.4 F (36.9 C)  TempSrc: Oral Oral Oral Oral  SpO2: 100% 100% 100%   Weight:      Height:       General: alert, cooperative and no distress Lochia: appropriate Uterine Fundus: firm Incision: Healing well with no significant drainage DVT Evaluation: No evidence of DVT seen on physical exam. Labs: Lab Results  Component Value Date   WBC 18.7 (H) 05/28/2019   HGB 9.4 (L) 05/28/2019   HCT 27.9 (L) 05/28/2019   MCV 95.9 05/28/2019   PLT 332 05/28/2019   CMP Latest Ref Rng & Units 11/19/2018  Glucose 70 - 99 mg/dL 81  BUN 6 - 20 mg/dL 8  Creatinine 0.44 - 1.00 mg/dL  0.54  Sodium 135 - 145 mmol/L 134(L)  Potassium 3.5 - 5.1 mmol/L 3.9  Chloride 98 - 111 mmol/L 105  CO2 22 - 32 mmol/L 21(L)  Calcium 8.9 - 10.3 mg/dL 9.0  Total Protein 6.5 - 8.1 g/dL -  Total Bilirubin 0.3 - 1.2 mg/dL -  Alkaline Phos 38 - 126 U/L -  AST 15 - 41 U/L -  ALT 0 - 44 U/L -    Discharge instruction: per After Visit Summary and "Baby and Me Booklet".  After visit meds:  Allergies as of 05/29/2019   No Known Allergies     Medication List    STOP taking these medications   metoCLOPramide 10 MG tablet Commonly known as: REGLAN   prenatal multivitamin Tabs tablet     TAKE these medications   acetaminophen 500 MG tablet Commonly known as: TYLENOL Take 2 tablets (1,000 mg total) by mouth every 6 (six) hours.   ibuprofen 800 MG  tablet Commonly known as: ADVIL Take 1 tablet (800 mg total) by mouth every 8 (eight) hours as needed.   oxyCODONE 5 MG immediate release tablet Commonly known as: Oxy IR/ROXICODONE Take 1-2 tablets (5-10 mg total) by mouth every 4 (four) hours as needed for up to 7 days for moderate pain or severe pain.   promethazine 25 MG tablet Commonly known as: PHENERGAN Take 0.5-1 tablets (12.5-25 mg total) by mouth every 6 (six) hours as needed for nausea.       Diet: routine diet  Activity: Advance as tolerated. Pelvic rest for 6 weeks.   Outpatient follow up:2 weeks Follow up Appt:No future appointments. Follow up Visit:No follow-ups on file.  Postpartum contraception: Abstinence  Newborn Data: Live born female  Birth Weight: 6 lb 15.3 oz (3155 g) APGAR: 8, 9  Newborn Delivery   Birth date/time: 05/27/2019 08:00:00 Delivery type: C-Section, Low Transverse Trial of labor: No C-section categorization: Primary      Baby Feeding: Bottle Disposition:home with mother   05/29/2019 Gerald Leitz, MD

## 2019-08-08 ENCOUNTER — Other Ambulatory Visit: Payer: Self-pay

## 2019-08-08 ENCOUNTER — Emergency Department (HOSPITAL_COMMUNITY)
Admission: EM | Admit: 2019-08-08 | Discharge: 2019-08-08 | Disposition: A | Discharge status: Home / Self Care | Payer: 59 | Attending: Emergency Medicine | Admitting: Emergency Medicine

## 2019-08-08 DIAGNOSIS — N920 Excessive and frequent menstruation with regular cycle: Secondary | ICD-10-CM | POA: Insufficient documentation

## 2019-08-08 DIAGNOSIS — R112 Nausea with vomiting, unspecified: Secondary | ICD-10-CM | POA: Diagnosis not present

## 2019-08-08 DIAGNOSIS — Z5321 Procedure and treatment not carried out due to patient leaving prior to being seen by health care provider: Secondary | ICD-10-CM | POA: Insufficient documentation

## 2019-08-08 NOTE — ED Notes (Signed)
No answer in waiting room 

## 2020-07-09 ENCOUNTER — Emergency Department (HOSPITAL_COMMUNITY): Payer: Medicaid Other

## 2020-07-09 ENCOUNTER — Encounter (HOSPITAL_COMMUNITY): Payer: Self-pay

## 2020-07-09 ENCOUNTER — Emergency Department (HOSPITAL_COMMUNITY)
Admission: EM | Admit: 2020-07-09 | Discharge: 2020-07-09 | Disposition: A | Discharge status: Home / Self Care | Payer: Medicaid Other | Attending: Emergency Medicine | Admitting: Emergency Medicine

## 2020-07-09 DIAGNOSIS — O209 Hemorrhage in early pregnancy, unspecified: Secondary | ICD-10-CM | POA: Diagnosis present

## 2020-07-09 DIAGNOSIS — O200 Threatened abortion: Secondary | ICD-10-CM | POA: Insufficient documentation

## 2020-07-09 DIAGNOSIS — Z3A00 Weeks of gestation of pregnancy not specified: Secondary | ICD-10-CM | POA: Insufficient documentation

## 2020-07-09 LAB — I-STAT CHEM 8, ED
BUN: 11 mg/dL (ref 6–20)
Calcium, Ion: 1.24 mmol/L (ref 1.15–1.40)
Chloride: 106 mmol/L (ref 98–111)
Creatinine, Ser: 0.8 mg/dL (ref 0.44–1.00)
Glucose, Bld: 88 mg/dL (ref 70–99)
HCT: 38 % (ref 36.0–46.0)
Hemoglobin: 12.9 g/dL (ref 12.0–15.0)
Potassium: 3.8 mmol/L (ref 3.5–5.1)
Sodium: 140 mmol/L (ref 135–145)
TCO2: 25 mmol/L (ref 22–32)

## 2020-07-09 LAB — I-STAT BETA HCG BLOOD, ED (MC, WL, AP ONLY): I-stat hCG, quantitative: >2000 m[IU]/mL — ABNORMAL HIGH (ref <5)

## 2020-07-09 LAB — HCG, QUANTITATIVE, PREGNANCY: hCG, Beta Chain, Quant, S: 4742 m[IU]/mL — ABNORMAL HIGH (ref <5)

## 2020-07-09 LAB — WET PREP, GENITAL
Clue Cells Wet Prep HPF POC: NONE SEEN
Sperm: NONE SEEN
Trich, Wet Prep: NONE SEEN
WBC, Wet Prep HPF POC: NONE SEEN
Yeast Wet Prep HPF POC: NONE SEEN

## 2020-07-09 NOTE — ED Notes (Signed)
Pt discharged from this ED in stable condition at this time. All discharge instructions and follow up care reviewed with pt with no further questions at this time. Pt ambulatory with steady gait, clear speech.  

## 2020-07-09 NOTE — ED Notes (Signed)
Pelvic exam set up at bedside.  

## 2020-07-09 NOTE — ED Provider Notes (Signed)
[facility]-EMERGENCY DEPT Provider Note   CSN: [id number] Arrival date & time: 07/09/20  0920     History Chief Complaint  Patient presents with   Vaginal Bleeding    Savannah Carr is a 25 y.o. female G1, P1, LMP 06/22/2020.  She is presenting for evaluation of vaginal bleeding that began this morning.  She states this is not her expected.  As she just had one at the end of May.  She has been passing blood clots, as large as a golf ball.  She is not having any significant abdominal pain.  She is not feeling lightheaded or weak.  This is never happened before.  She wonders if she is having a miscarriage so has not taken any pregnancy tests at home. She is sexually active with female partner without protection. Last delivered 1 year ago by c-section. Not on thinners. Does take OCPs.  The history is provided by the patient.      Past Medical History:  Diagnosis Date   Medical history non-contributory     Patient Active Problem List   Diagnosis Date Noted   S/P cesarean section 05/27/2019    Past Surgical History:  Procedure Laterality Date   CESAREAN SECTION N/A 05/27/2019   Procedure: CESAREAN SECTION-REQUEST RNFA;  Surgeon: Gerald Leitz, MD;  Location: [facility] LD ORS;  Service: Obstetrics;  Laterality: N/A;  Heather, RNFA   NO PAST SURGERIES       OB History     Gravida  1   Para  1   Term  1   Preterm      AB      Living  1      SAB      IAB      Ectopic      Multiple  0   Live Births  1           Family History  Problem Relation Age of Onset   Hypertension Mother    Hypertension Maternal Grandfather    Cancer Paternal Grandfather     Social History   Tobacco Use   Smoking status: Never   Smokeless tobacco: Never  Vaping Use   Vaping Use: Never used  Substance Use Topics   Alcohol use: No   Drug use: No    Home Medications Prior to Admission medications   Medication Sig Start Date End Date Taking? Authorizing  Provider  acetaminophen (TYLENOL) 500 MG tablet Take 2 tablets (1,000 mg total) by mouth every 6 (six) hours. 05/29/19   Gerald Leitz, MD  ibuprofen (ADVIL) 800 MG tablet Take 1 tablet (800 mg total) by mouth every 8 (eight) hours as needed. 05/29/19   Gerald Leitz, MD  promethazine (PHENERGAN) 25 MG tablet Take 0.5-1 tablets (12.5-25 mg total) by mouth every 6 (six) hours as needed for nausea. Patient not taking: Reported on 05/24/2019 10/11/18   Hurshel Party, CNM    Allergies    Patient has no known allergies.  Review of Systems   Review of Systems  Gastrointestinal:  Negative for abdominal pain.  Genitourinary:  Positive for vaginal bleeding.  All other systems reviewed and are negative.  Physical Exam Updated Vital Signs BP 138/89   Pulse 77   Temp 98.1 F (36.7 C) (Oral)   Resp 18   LMP 06/22/2020 (Approximate)   SpO2 100%   Physical Exam Vitals and nursing note reviewed.  Constitutional:      General: She is not in acute  distress.    Appearance: She is well-developed. She is not ill-appearing.  HENT:     Head: Normocephalic and atraumatic.  Eyes:     Conjunctiva/sclera: Conjunctivae normal.  Cardiovascular:     Rate and Rhythm: Normal rate and regular rhythm.  Pulmonary:     Effort: Pulmonary effort is normal. No respiratory distress.     Breath sounds: Normal breath sounds.  Abdominal:     General: Bowel sounds are normal.     Palpations: Abdomen is soft.     Tenderness: There is no abdominal tenderness.  Genitourinary:    Comments: Exam performed with female nurse tech chaperone present.  There is active bright red bleeding with clots.  No obvious tissue within the cervical os noted.  No tenderness on bimanual exam. Skin:    General: Skin is warm.  Neurological:     Mental Status: She is alert.  Psychiatric:        Behavior: Behavior normal.    ED Results / Procedures / Treatments   Labs (all labs ordered are listed, but only abnormal results are  displayed) Labs Reviewed  HCG, QUANTITATIVE, PREGNANCY - Abnormal; Notable for the following components:      Result Value   hCG, Beta Chain, Quant, S 4,742 (*)    All other components within normal limits  I-STAT BETA HCG BLOOD, ED ([facility], [facility], [facility] ONLY) - Abnormal; Notable for the following components:   I-stat hCG, quantitative >2,000.0 (*)    All other components within normal limits  WET PREP, GENITAL  I-STAT CHEM 8, ED  GC/CHLAMYDIA PROBE AMP ([place]) NOT AT [facility]    EKG None  Radiology US OB Comp < 14 Wks  Result Date: 07/09/2020 CLINICAL DATA:  First trimester pregnancy, vaginal bleeding, LMP 06/22/2020 EXAM: OBSTETRIC <14 WK [place] AND TRANSVAGINAL OB US TECHNIQUE: Both transabdominal and transvaginal ultrasound examinations were performed for complete evaluation of the gestation as well as the maternal uterus, adnexal regions, and pelvic cul-de-sac. Transvaginal technique was performed to assess early pregnancy. COMPARISON:  None FINDINGS: Intrauterine gestational sac: None identified Yolk sac:  N/A Embryo:  N/A Cardiac Activity: N/A Heart Rate: N/A  bpm MSD:   mm    w     d CRL:    mm    w    d                  [place] EDC: Subchorionic hemorrhage:  N/A Maternal uterus/adnexae: Uterus retroverted, unremarkable. No focal uterine mass. Endometrial complex 8 mm thick without fluid or gestational sac. Ovaries unremarkable. Trace free pelvic fluid. No adnexal masses. IMPRESSION: No intrauterine gestation identified. Findings are consistent with pregnancy of unknown location. Differential diagnosis includes early intrauterine pregnancy too early to visualize, spontaneous abortion, and ectopic pregnancy. Serial quantitative beta HCG and or follow-up ultrasound recommended to definitively exclude ectopic pregnancy. Electronically Signed   By: Ulyses Southward M.D.   On: 07/09/2020 11:45   US OB Transvaginal  Result Date: 07/09/2020 CLINICAL DATA:  First trimester pregnancy, vaginal bleeding, LMP  06/22/2020 EXAM: OBSTETRIC <14 WK [place] AND TRANSVAGINAL OB US TECHNIQUE: Both transabdominal and transvaginal ultrasound examinations were performed for complete evaluation of the gestation as well as the maternal uterus, adnexal regions, and pelvic cul-de-sac. Transvaginal technique was performed to assess early pregnancy. COMPARISON:  None FINDINGS: Intrauterine gestational sac: None identified Yolk sac:  N/A Embryo:  N/A Cardiac Activity: N/A Heart Rate: N/A  bpm MSD:   mm  w     d CRL:    mm    w    d                  [place] EDC: Subchorionic hemorrhage:  N/A Maternal uterus/adnexae: Uterus retroverted, unremarkable. No focal uterine mass. Endometrial complex 8 mm thick without fluid or gestational sac. Ovaries unremarkable. Trace free pelvic fluid. No adnexal masses. IMPRESSION: No intrauterine gestation identified. Findings are consistent with pregnancy of unknown location. Differential diagnosis includes early intrauterine pregnancy too early to visualize, spontaneous abortion, and ectopic pregnancy. Serial quantitative beta HCG and or follow-up ultrasound recommended to definitively exclude ectopic pregnancy. Electronically Signed   By: Ulyses Southward M.D.   On: 07/09/2020 11:45    Procedures Procedures   Medications Ordered in ED Medications - No data to display  ED Course  I have reviewed the triage vital signs and the nursing notes.  Pertinent labs & imaging results that were available during my care of the patient were reviewed by me and considered in my medical decision making (see chart for details).  Clinical Course as of 07/09/20 1225  Thu Jul 09, 2020  1011 Passing quite a bit of bright red blood clot [JR]  1046 Blood type is B positive per prior type and screen [JR]  1046 Consulted with OB provider on call for [facility] physicians.  Recommends with stable H&H and vital signs, patient is appropriate for close outpatient follow-up.  OB clinic to reach out to patient to arrange close follow-up.   Recommend strict return precautions. appreciate consultation. [JR]    Clinical Course User Index [JR] Asir Bingley, [place] N, PA-C   MDM Rules/Calculators/A&P                          Patient is G2, P1 presenting for evaluation of heavy vaginal bleeding that began this morning.  Is concerned for miscarriage though had no home pregnancy test.  Is followed by [facility] physicians.  On exam she is well-appearing with stable vital signs.  Abdomen is benign.  Pelvic exam with bright red blood clots.  I-STAT hCG is positive, this is followed with quantitative hCG at around 4700.  Pelvic ultrasound shows no evidence of intrauterine products or any adnexal findings.  Discussed with Dr. Connye Burkitt on-call for [facility] physicians.  Agrees this is concerning for miscarriage.  However as her hemoglobin is stable here at 12.9, vital signs remained stable, and she is well-appearing, recommends close outpatient follow-up.  Reports Deboraha Sprang will reach out to patient to schedule close appointment.  She is provided with strict return precautions including worsening bleeding, development of pain, or feeling weak or fatigued.  Patient is in agreement with this plan, discharged in no distress  Discussed results, findings, treatment and follow up. Patient advised of return precautions. Patient verbalized understanding and agreed with plan.  Final Clinical Impression(s) / ED Diagnoses Final diagnoses:  Miscarriage, threatened, early pregnancy    Rx / DC Orders ED Discharge Orders     None        Tyshell Ramberg, [place] N, PA-C 07/09/20 1225    Jacalyn Lefevre, MD 07/09/20 1415

## 2020-07-09 NOTE — Discharge Instructions (Addendum)
Your OB office should be contacting you shortly to arrange close follow-up. If your symptoms worsen in any way, you can return to the emergency department or the maternity assessment unit at [facility] (entrance C for labor and delivery).

## 2020-07-09 NOTE — ED Notes (Signed)
Lab called for add on Hcg quantitative

## 2020-07-09 NOTE — ED Triage Notes (Signed)
Pt arrived via walk in, c/o heavy vaginal bleeding since this morning, with large clots, x2 pads since 7am. States had normal menstrual cycle last week, was spotting slightly this week and then heavy bleeding started this morning.

## 2020-07-10 LAB — GC/CHLAMYDIA PROBE AMP (~~LOC~~) NOT AT ARMC
Chlamydia: NEGATIVE
Comment: NEGATIVE
Comment: NORMAL
Neisseria Gonorrhea: NEGATIVE

## 2021-05-27 LAB — OB RESULTS CONSOLE ABO/RH: RH Type: POSITIVE

## 2021-05-27 LAB — OB RESULTS CONSOLE HEPATITIS B SURFACE ANTIGEN
Hepatitis B Surface Ag: NEGATIVE
Hepatitis B Surface Ag: NEGATIVE

## 2021-05-27 LAB — OB RESULTS CONSOLE ANTIBODY SCREEN: Antibody Screen: NEGATIVE

## 2021-05-27 LAB — HEPATITIS C ANTIBODY
HCV Ab: NEGATIVE
HCV Ab: NEGATIVE

## 2021-05-27 LAB — OB RESULTS CONSOLE HIV ANTIBODY (ROUTINE TESTING)
HIV: NONREACTIVE
HIV: NONREACTIVE

## 2021-05-27 LAB — OB RESULTS CONSOLE RPR
RPR: NONREACTIVE
RPR: NONREACTIVE

## 2021-05-27 LAB — OB RESULTS CONSOLE RUBELLA ANTIBODY, IGM
Rubella: IMMUNE
Rubella: IMMUNE

## 2021-05-31 LAB — OB RESULTS CONSOLE GC/CHLAMYDIA
Chlamydia: NEGATIVE
Chlamydia: NEGATIVE
Neisseria Gonorrhea: NEGATIVE
Neisseria Gonorrhea: NEGATIVE

## 2021-12-01 LAB — OB RESULTS CONSOLE GBS
GBS: NEGATIVE
GBS: NEGATIVE

## 2021-12-09 ENCOUNTER — Other Ambulatory Visit: Payer: Self-pay | Admitting: Obstetrics and Gynecology

## 2021-12-09 ENCOUNTER — Encounter (HOSPITAL_COMMUNITY): Payer: Self-pay

## 2021-12-09 NOTE — Patient Instructions (Signed)
Savannah Carr  12/09/2021   Your procedure is scheduled on:  12/18/2021  Arrive at 0730 at Entrance C on [address] at [facility]  and [employer]. You are invited to use the FREE valet parking or use the Visitor's parking deck.  Pick up the phone at the desk and dial [phone].  Call this number if you have problems the morning of surgery: [phone]  Remember:   Do not eat food:(After Midnight) Desps de medianoche.  Do not drink clear liquids: (After Midnight) Desps de medianoche.  Take these medicines the morning of surgery with A SIP OF WATER:  none   Do not wear jewelry, make-up or nail polish.  Do not wear lotions, powders, or perfumes. Do not wear deodorant.  Do not shave 48 hours prior to surgery.  Do not bring valuables to the hospital.  [facility] is not   responsible for any belongings or valuables brought to the hospital.  Contacts, dentures or bridgework may not be worn into surgery.  Leave suitcase in the car. After surgery it may be brought to your room.  For patients admitted to the hospital, checkout time is 11:00 AM the day of              discharge.      Please read over the following fact sheets that you were given:     Preparing for Surgery

## 2021-12-10 ENCOUNTER — Telehealth (HOSPITAL_COMMUNITY): Payer: Self-pay | Admitting: *Deleted

## 2021-12-10 NOTE — Telephone Encounter (Signed)
Preadmission screen  

## 2021-12-13 ENCOUNTER — Encounter (HOSPITAL_COMMUNITY): Payer: Self-pay

## 2021-12-13 NOTE — Anesthesia Preprocedure Evaluation (Addendum)
Anesthesia Evaluation  Patient identified by MRN, date of birth, ID band Patient awake    Reviewed: Allergy & Precautions, NPO status , Patient's Chart, lab work & pertinent test results  Airway Mallampati: II  TM Distance: >3 FB Neck ROM: Full    Dental no notable dental hx.    Pulmonary neg pulmonary ROS   Pulmonary exam normal breath sounds clear to auscultation       Cardiovascular negative cardio ROS Normal cardiovascular exam Rhythm:Regular Rate:Normal     Neuro/Psych negative neurological ROS  negative psych ROS   GI/Hepatic negative GI ROS, Neg liver ROS,,,  Endo/Other  negative endocrine ROS    Renal/GU negative Renal ROS  negative genitourinary   Musculoskeletal negative musculoskeletal ROS (+)    Abdominal   Peds  Hematology  (+) Blood dyscrasia, anemia Hb 11.2, plt 465   Anesthesia Other Findings   Reproductive/Obstetrics (+) Pregnancy Prior section 2021                             Anesthesia Physical Anesthesia Plan  ASA: 2  Anesthesia Plan: Spinal   Post-op Pain Management: Regional block*, Toradol IV (intra-op)* and Ofirmev IV (intra-op)*   Induction:   PONV Risk Score and Plan: 2 and Ondansetron, Dexamethasone and Treatment may vary due to age or medical condition  Airway Management Planned: Natural Airway  Additional Equipment: None  Intra-op Plan:   Post-operative Plan:   Informed Consent: I have reviewed the patients History and Physical, chart, labs and discussed the procedure including the risks, benefits and alternatives for the proposed anesthesia with the patient or authorized representative who has indicated his/her understanding and acceptance.       Plan Discussed with: CRNA  Anesthesia Plan Comments:        Anesthesia Quick Evaluation

## 2021-12-15 ENCOUNTER — Encounter (HOSPITAL_COMMUNITY)
Admission: RE | Admit: 2021-12-15 | Discharge: 2021-12-15 | Disposition: A | Discharge status: Home / Self Care | Payer: Medicaid Other | Source: Ambulatory Visit | Attending: Obstetrics and Gynecology | Admitting: Obstetrics and Gynecology

## 2021-12-15 VITALS — Ht 67.0 in | Wt 205.0 lb

## 2021-12-15 DIAGNOSIS — Z98891 History of uterine scar from previous surgery: Secondary | ICD-10-CM | POA: Insufficient documentation

## 2021-12-15 LAB — CBC
HCT: 32.6 % — ABNORMAL LOW (ref 36.0–46.0)
Hemoglobin: 11.2 g/dL — ABNORMAL LOW (ref 12.0–15.0)
MCH: 32.8 pg (ref 26.0–34.0)
MCHC: 34.4 g/dL (ref 30.0–36.0)
MCV: 95.6 fL (ref 80.0–100.0)
Platelets: 465 10*3/uL — ABNORMAL HIGH (ref 150–400)
RBC: 3.41 MIL/uL — ABNORMAL LOW (ref 3.87–5.11)
RDW: 12.8 % (ref 11.5–15.5)
WBC: 10.3 10*3/uL (ref 4.0–10.5)
nRBC: 0 % (ref 0.0–0.2)

## 2021-12-15 LAB — BASIC METABOLIC PANEL
Anion gap: 7 (ref 5–15)
BUN: 6 mg/dL (ref 6–20)
CO2: 24 mmol/L (ref 22–32)
Calcium: 8.9 mg/dL (ref 8.9–10.3)
Chloride: 104 mmol/L (ref 98–111)
Creatinine, Ser: 0.52 mg/dL (ref 0.44–1.00)
GFR, Estimated: >60 mL/min (ref >60)
Glucose, Bld: 72 mg/dL (ref 70–99)
Potassium: 3.7 mmol/L (ref 3.5–5.1)
Sodium: 135 mmol/L (ref 135–145)

## 2021-12-15 LAB — TYPE AND SCREEN
ABO/RH(D): B POS
Antibody Screen: NEGATIVE

## 2021-12-15 LAB — RPR: RPR Ser Ql: NONREACTIVE

## 2021-12-18 ENCOUNTER — Encounter (HOSPITAL_COMMUNITY): Payer: Self-pay | Admitting: Obstetrics and Gynecology

## 2021-12-18 ENCOUNTER — Encounter (HOSPITAL_COMMUNITY)
Admission: RE | Disposition: A | Discharge status: Home / Self Care | Payer: Self-pay | Source: Ambulatory Visit | Attending: Obstetrics and Gynecology

## 2021-12-18 ENCOUNTER — Inpatient Hospital Stay (HOSPITAL_COMMUNITY)
Admission: RE | Admit: 2021-12-18 | Discharge: 2021-12-20 | DRG: 788 | Disposition: A | Discharge status: Home / Self Care | Payer: Medicaid Other | Source: Ambulatory Visit | Attending: Obstetrics and Gynecology | Admitting: Obstetrics and Gynecology

## 2021-12-18 ENCOUNTER — Inpatient Hospital Stay (HOSPITAL_COMMUNITY): Payer: Medicaid Other | Admitting: Anesthesiology

## 2021-12-18 ENCOUNTER — Other Ambulatory Visit: Payer: Self-pay

## 2021-12-18 DIAGNOSIS — K660 Peritoneal adhesions (postprocedural) (postinfection): Secondary | ICD-10-CM

## 2021-12-18 DIAGNOSIS — O9972 Diseases of the skin and subcutaneous tissue complicating childbirth: Secondary | ICD-10-CM | POA: Diagnosis present

## 2021-12-18 DIAGNOSIS — O3483 Maternal care for other abnormalities of pelvic organs, third trimester: Secondary | ICD-10-CM | POA: Diagnosis not present

## 2021-12-18 DIAGNOSIS — Z3A39 39 weeks gestation of pregnancy: Secondary | ICD-10-CM | POA: Diagnosis not present

## 2021-12-18 DIAGNOSIS — O34211 Maternal care for low transverse scar from previous cesarean delivery: Secondary | ICD-10-CM

## 2021-12-18 DIAGNOSIS — Z98891 History of uterine scar from previous surgery: Principal | ICD-10-CM

## 2021-12-18 DIAGNOSIS — L910 Hypertrophic scar: Secondary | ICD-10-CM | POA: Diagnosis present

## 2021-12-18 DIAGNOSIS — Z3A37 37 weeks gestation of pregnancy: Secondary | ICD-10-CM | POA: Diagnosis not present

## 2021-12-18 LAB — CBC
HCT: 32.4 % — ABNORMAL LOW (ref 36.0–46.0)
Hemoglobin: 11 g/dL — ABNORMAL LOW (ref 12.0–15.0)
MCH: 31.9 pg (ref 26.0–34.0)
MCHC: 34 g/dL (ref 30.0–36.0)
MCV: 93.9 fL (ref 80.0–100.0)
Platelets: 372 10*3/uL (ref 150–400)
RBC: 3.45 MIL/uL — ABNORMAL LOW (ref 3.87–5.11)
RDW: 12.7 % (ref 11.5–15.5)
WBC: 22.5 10*3/uL — ABNORMAL HIGH (ref 4.0–10.5)
nRBC: 0 % (ref 0.0–0.2)

## 2021-12-18 LAB — BASIC METABOLIC PANEL
Anion gap: 10 (ref 5–15)
BUN: 7 mg/dL (ref 6–20)
CO2: 20 mmol/L — ABNORMAL LOW (ref 22–32)
Calcium: 8.9 mg/dL (ref 8.9–10.3)
Chloride: 105 mmol/L (ref 98–111)
Creatinine, Ser: 0.46 mg/dL (ref 0.44–1.00)
GFR, Estimated: >60 mL/min (ref >60)
Glucose, Bld: 83 mg/dL (ref 70–99)
Potassium: 3.8 mmol/L (ref 3.5–5.1)
Sodium: 135 mmol/L (ref 135–145)

## 2021-12-18 LAB — TYPE AND SCREEN
ABO/RH(D): B POS
Antibody Screen: NEGATIVE

## 2021-12-18 SURGERY — Surgical Case
Anesthesia: Spinal | Wound class: Clean Contaminated

## 2021-12-18 MED ORDER — PHENYLEPHRINE 80 MCG/ML (10ML) SYRINGE FOR IV PUSH (FOR BLOOD PRESSURE SUPPORT)
PREFILLED_SYRINGE | INTRAVENOUS | Status: AC
  Filled 2021-12-18: qty 10

## 2021-12-18 MED ORDER — TRIAMCINOLONE ACETONIDE 40 MG/ML IJ SUSP
INTRAMUSCULAR | Status: AC
  Filled 2021-12-18: qty 1

## 2021-12-18 MED ORDER — PHENYLEPHRINE HCL-NACL 20-0.9 MG/250ML-% IV SOLN
INTRAVENOUS | Status: DC | PRN
  Administered 2021-12-18: 60 ug/min via INTRAVENOUS

## 2021-12-18 MED ORDER — ACETAMINOPHEN 500 MG PO TABS
1000.0000 mg | ORAL_TABLET | Freq: Four times a day (QID) | ORAL | Status: DC | PRN
  Administered 2021-12-20: 1000 mg via ORAL
  Filled 2021-12-18: qty 2

## 2021-12-18 MED ORDER — WITCH HAZEL-GLYCERIN EX PADS: 1.0000 | MEDICATED_PAD | CUTANEOUS | Status: DC | PRN

## 2021-12-18 MED ORDER — NALOXONE HCL 4 MG/10ML IJ SOLN: 1.0000 ug/kg/h | INTRAVENOUS | Status: DC | PRN

## 2021-12-18 MED ORDER — DIBUCAINE (PERIANAL) 1 % EX OINT: 1.0000 | TOPICAL_OINTMENT | CUTANEOUS | Status: DC | PRN

## 2021-12-18 MED ORDER — ACETAMINOPHEN 10 MG/ML IV SOLN
INTRAVENOUS | Status: AC
  Filled 2021-12-18: qty 100

## 2021-12-18 MED ORDER — ONDANSETRON HCL 4 MG/2ML IJ SOLN
INTRAMUSCULAR | Status: DC | PRN
  Administered 2021-12-18: 4 mg via INTRAVENOUS

## 2021-12-18 MED ORDER — DIPHENHYDRAMINE HCL 50 MG/ML IJ SOLN: 12.5000 mg | INTRAMUSCULAR | Status: DC | PRN

## 2021-12-18 MED ORDER — ACETAMINOPHEN 10 MG/ML IV SOLN
INTRAVENOUS | Status: DC | PRN
  Administered 2021-12-18: 1000 mg via INTRAVENOUS

## 2021-12-18 MED ORDER — KETOROLAC TROMETHAMINE 30 MG/ML IJ SOLN: 30.0000 mg | Freq: Four times a day (QID) | INTRAMUSCULAR | Status: AC | PRN

## 2021-12-18 MED ORDER — TRIAMCINOLONE ACETONIDE 40 MG/ML IJ SUSP
INTRAMUSCULAR | Status: DC | PRN
  Administered 2021-12-18: 40 mg via INTRAMUSCULAR

## 2021-12-18 MED ORDER — DIPHENHYDRAMINE HCL 25 MG PO CAPS: 25.0000 mg | ORAL_CAPSULE | Freq: Four times a day (QID) | ORAL | Status: DC | PRN

## 2021-12-18 MED ORDER — MENTHOL 3 MG MT LOZG: 1.0000 | LOZENGE | OROMUCOSAL | Status: DC | PRN

## 2021-12-18 MED ORDER — KETOROLAC TROMETHAMINE 30 MG/ML IJ SOLN
INTRAMUSCULAR | Status: AC
  Filled 2021-12-18: qty 1

## 2021-12-18 MED ORDER — SODIUM CHLORIDE 0.9% FLUSH: 3.0000 mL | INTRAVENOUS | Status: DC | PRN

## 2021-12-18 MED ORDER — DEXAMETHASONE SODIUM PHOSPHATE 10 MG/ML IJ SOLN
INTRAMUSCULAR | Status: DC | PRN
  Administered 2021-12-18: 8 mg via INTRAVENOUS

## 2021-12-18 MED ORDER — SIMETHICONE 80 MG PO CHEW: 80.0000 mg | CHEWABLE_TABLET | ORAL | Status: DC | PRN

## 2021-12-18 MED ORDER — SCOPOLAMINE 1 MG/3DAYS TD PT72
MEDICATED_PATCH | TRANSDERMAL | Status: AC
  Filled 2021-12-18: qty 1

## 2021-12-18 MED ORDER — OXYCODONE HCL 5 MG PO TABS: 5.0000 mg | ORAL_TABLET | Freq: Four times a day (QID) | ORAL | Status: DC | PRN

## 2021-12-18 MED ORDER — KETOROLAC TROMETHAMINE 30 MG/ML IJ SOLN
30.0000 mg | Freq: Four times a day (QID) | INTRAMUSCULAR | Status: AC
  Administered 2021-12-18 – 2021-12-19 (×4): 30 mg via INTRAVENOUS
  Filled 2021-12-18 (×4): qty 1

## 2021-12-18 MED ORDER — OXYTOCIN-SODIUM CHLORIDE 30-0.9 UT/500ML-% IV SOLN
INTRAVENOUS | Status: DC | PRN
  Administered 2021-12-18: 400 mL via INTRAVENOUS

## 2021-12-18 MED ORDER — HYDROMORPHONE HCL 1 MG/ML IJ SOLN: 0.2500 mg | INTRAMUSCULAR | Status: DC | PRN

## 2021-12-18 MED ORDER — SCOPOLAMINE 1 MG/3DAYS TD PT72
1.0000 | MEDICATED_PATCH | Freq: Once | TRANSDERMAL | Status: DC
  Administered 2021-12-18: 1.5 mg via TRANSDERMAL

## 2021-12-18 MED ORDER — MEPERIDINE HCL 25 MG/ML IJ SOLN: 6.2500 mg | INTRAMUSCULAR | Status: DC | PRN

## 2021-12-18 MED ORDER — BUPIVACAINE IN DEXTROSE 0.75-8.25 % IT SOLN
INTRATHECAL | Status: DC | PRN
  Administered 2021-12-18: 1.6 mL via INTRATHECAL

## 2021-12-18 MED ORDER — PHENYLEPHRINE HCL-NACL 20-0.9 MG/250ML-% IV SOLN
INTRAVENOUS | Status: AC
  Filled 2021-12-18: qty 250

## 2021-12-18 MED ORDER — CEFAZOLIN SODIUM-DEXTROSE 2-4 GM/100ML-% IV SOLN
2.0000 g | INTRAVENOUS | Status: AC
  Administered 2021-12-18: 2 g via INTRAVENOUS

## 2021-12-18 MED ORDER — KETOROLAC TROMETHAMINE 30 MG/ML IJ SOLN: 30.0000 mg | Freq: Once | INTRAMUSCULAR | Status: DC | PRN

## 2021-12-18 MED ORDER — MORPHINE SULFATE (PF) 0.5 MG/ML IJ SOLN
INTRAMUSCULAR | Status: AC
  Filled 2021-12-18: qty 10

## 2021-12-18 MED ORDER — OXYTOCIN-SODIUM CHLORIDE 30-0.9 UT/500ML-% IV SOLN
INTRAVENOUS | Status: AC
  Filled 2021-12-18: qty 500

## 2021-12-18 MED ORDER — ONDANSETRON HCL 4 MG/2ML IJ SOLN: 4.0000 mg | Freq: Once | INTRAMUSCULAR | Status: DC | PRN

## 2021-12-18 MED ORDER — MORPHINE SULFATE (PF) 0.5 MG/ML IJ SOLN
INTRAMUSCULAR | Status: DC | PRN
  Administered 2021-12-18: 150 ug via INTRATHECAL

## 2021-12-18 MED ORDER — OXYCODONE HCL 5 MG PO TABS: 5.0000 mg | ORAL_TABLET | Freq: Once | ORAL | Status: DC | PRN

## 2021-12-18 MED ORDER — FENTANYL CITRATE (PF) 100 MCG/2ML IJ SOLN
INTRAMUSCULAR | Status: DC | PRN
  Administered 2021-12-18: 15 ug via INTRATHECAL

## 2021-12-18 MED ORDER — ONDANSETRON HCL 4 MG/2ML IJ SOLN
INTRAMUSCULAR | Status: AC
  Filled 2021-12-18: qty 2

## 2021-12-18 MED ORDER — ACETAMINOPHEN 500 MG PO TABS
1000.0000 mg | ORAL_TABLET | Freq: Four times a day (QID) | ORAL | Status: AC
  Administered 2021-12-18 – 2021-12-19 (×4): 1000 mg via ORAL
  Filled 2021-12-18 (×4): qty 2

## 2021-12-18 MED ORDER — OXYTOCIN-SODIUM CHLORIDE 30-0.9 UT/500ML-% IV SOLN: 2.5000 [IU]/h | INTRAVENOUS | Status: AC

## 2021-12-18 MED ORDER — POVIDONE-IODINE 10 % EX SWAB
2.0000 | Freq: Once | CUTANEOUS | Status: AC
  Administered 2021-12-18: 2 via TOPICAL

## 2021-12-18 MED ORDER — CEFAZOLIN SODIUM-DEXTROSE 2-4 GM/100ML-% IV SOLN
INTRAVENOUS | Status: AC
  Filled 2021-12-18: qty 100

## 2021-12-18 MED ORDER — FENTANYL CITRATE (PF) 100 MCG/2ML IJ SOLN
INTRAMUSCULAR | Status: AC
  Filled 2021-12-18: qty 2

## 2021-12-18 MED ORDER — IBUPROFEN 600 MG PO TABS
600.0000 mg | ORAL_TABLET | Freq: Four times a day (QID) | ORAL | Status: DC
  Administered 2021-12-19 – 2021-12-20 (×4): 600 mg via ORAL
  Filled 2021-12-18 (×4): qty 1

## 2021-12-18 MED ORDER — SODIUM CHLORIDE (PF) 0.9 % IJ SOLN
INTRAMUSCULAR | Status: AC
  Filled 2021-12-18: qty 10

## 2021-12-18 MED ORDER — SENNOSIDES-DOCUSATE SODIUM 8.6-50 MG PO TABS
2.0000 | ORAL_TABLET | Freq: Every day | ORAL | Status: DC
  Administered 2021-12-19 – 2021-12-20 (×2): 2 via ORAL
  Filled 2021-12-18 (×2): qty 2

## 2021-12-18 MED ORDER — SIMETHICONE 80 MG PO CHEW
80.0000 mg | CHEWABLE_TABLET | Freq: Three times a day (TID) | ORAL | Status: DC
  Administered 2021-12-18 – 2021-12-19 (×3): 80 mg via ORAL
  Filled 2021-12-18 (×3): qty 1

## 2021-12-18 MED ORDER — COCONUT OIL OIL: 1.0000 | TOPICAL_OIL | Status: DC | PRN

## 2021-12-18 MED ORDER — METOCLOPRAMIDE HCL 5 MG/ML IJ SOLN
INTRAMUSCULAR | Status: DC | PRN
  Administered 2021-12-18: 10 mg via INTRAVENOUS

## 2021-12-18 MED ORDER — METOCLOPRAMIDE HCL 5 MG/ML IJ SOLN
INTRAMUSCULAR | Status: AC
  Filled 2021-12-18: qty 2

## 2021-12-18 MED ORDER — AMISULPRIDE (ANTIEMETIC) 5 MG/2ML IV SOLN: 10.0000 mg | Freq: Once | INTRAVENOUS | Status: DC | PRN

## 2021-12-18 MED ORDER — PRENATAL MULTIVITAMIN CH
1.0000 | ORAL_TABLET | Freq: Every day | ORAL | Status: DC
  Administered 2021-12-18 – 2021-12-20 (×3): 1 via ORAL
  Filled 2021-12-18 (×3): qty 1

## 2021-12-18 MED ORDER — NALOXONE HCL 0.4 MG/ML IJ SOLN: 0.4000 mg | INTRAMUSCULAR | Status: DC | PRN

## 2021-12-18 MED ORDER — ONDANSETRON HCL 4 MG/2ML IJ SOLN: 4.0000 mg | Freq: Three times a day (TID) | INTRAMUSCULAR | Status: DC | PRN

## 2021-12-18 MED ORDER — KETOROLAC TROMETHAMINE 30 MG/ML IJ SOLN
INTRAMUSCULAR | Status: DC | PRN
  Administered 2021-12-18: 30 mg via INTRAVENOUS

## 2021-12-18 MED ORDER — ZOLPIDEM TARTRATE 5 MG PO TABS: 5.0000 mg | ORAL_TABLET | Freq: Every evening | ORAL | Status: DC | PRN

## 2021-12-18 MED ORDER — DIPHENHYDRAMINE HCL 25 MG PO CAPS: 25.0000 mg | ORAL_CAPSULE | ORAL | Status: DC | PRN

## 2021-12-18 MED ORDER — DEXAMETHASONE SODIUM PHOSPHATE 4 MG/ML IJ SOLN
INTRAMUSCULAR | Status: AC
  Filled 2021-12-18: qty 2

## 2021-12-18 MED ORDER — LACTATED RINGERS IV SOLN: INTRAVENOUS | Status: DC

## 2021-12-18 MED ORDER — OXYCODONE HCL 5 MG/5ML PO SOLN: 5.0000 mg | Freq: Once | ORAL | Status: DC | PRN

## 2021-12-18 SURGICAL SUPPLY — 38 items
BENZOIN TINCTURE PRP APPL 2/3 (GAUZE/BANDAGES/DRESSINGS) ×1 IMPLANT
CHLORAPREP W/TINT 26 (MISCELLANEOUS) ×2 IMPLANT
CLAMP UMBILICAL CORD (MISCELLANEOUS) ×1 IMPLANT
CLOTH BEACON ORANGE TIMEOUT ST (SAFETY) ×1 IMPLANT
DRSG ALLEVYN 4X4 (GAUZE/BANDAGES/DRESSINGS) IMPLANT
DRSG OPSITE POSTOP 4X10 (GAUZE/BANDAGES/DRESSINGS) ×1 IMPLANT
ELECT REM PT RETURN 9FT ADLT (ELECTROSURGICAL) ×1
ELECTRODE REM PT RTRN 9FT ADLT (ELECTROSURGICAL) ×1 IMPLANT
EXTRACTOR VACUUM M CUP 4 TUBE (SUCTIONS) IMPLANT
GLOVE BIO SURGEON STRL SZ7.5 (GLOVE) ×1 IMPLANT
GLOVE BIOGEL PI IND STRL 7.0 (GLOVE) ×1 IMPLANT
GLOVE BIOGEL PI IND STRL 7.5 (GLOVE) ×1 IMPLANT
GOWN STRL REUS W/TWL LRG LVL3 (GOWN DISPOSABLE) ×2 IMPLANT
KIT ABG SYR 3ML LUER SLIP (SYRINGE) IMPLANT
NDL HYPO 25X5/8 SAFETYGLIDE (NEEDLE) IMPLANT
NEEDLE HYPO 25X5/8 SAFETYGLIDE (NEEDLE) IMPLANT
NS IRRIG 1000ML POUR BTL (IV SOLUTION) ×1 IMPLANT
PACK C SECTION WH (CUSTOM PROCEDURE TRAY) ×1 IMPLANT
PAD ABD 7.5X8 STRL (GAUZE/BANDAGES/DRESSINGS) IMPLANT
PAD OB MATERNITY 4.3X12.25 (PERSONAL CARE ITEMS) ×1 IMPLANT
PENCIL SMOKE EVAC W/HOLSTER (ELECTROSURGICAL) IMPLANT
PENCIL SMOKE EVACUATOR (MISCELLANEOUS) IMPLANT
RTRCTR C-SECT PINK 25CM LRG (MISCELLANEOUS) ×1 IMPLANT
STRIP CLOSURE SKIN 1/2X4 (GAUZE/BANDAGES/DRESSINGS) ×1 IMPLANT
SUT CHROMIC 2 0 CT 1 (SUTURE) ×1 IMPLANT
SUT MNCRL 0 VIOLET CTX 36 (SUTURE) ×1 IMPLANT
SUT MNCRL AB 3-0 PS2 27 (SUTURE) ×1 IMPLANT
SUT MONOCRYL 0 CTX 36 (SUTURE) ×1
SUT PLAIN 2 0 XLH (SUTURE) ×1 IMPLANT
SUT VIC AB 0 CT1 36 (SUTURE) ×1 IMPLANT
SUT VIC AB 0 CTX 36 (SUTURE) ×3
SUT VIC AB 0 CTX36XBRD ANBCTRL (SUTURE) ×3 IMPLANT
SUT VIC AB 2-0 SH 27 (SUTURE)
SUT VIC AB 2-0 SH 27XBRD (SUTURE) IMPLANT
TAPE CLOTH SURG 4X10 WHT LF (GAUZE/BANDAGES/DRESSINGS) IMPLANT
TOWEL OR 17X24 6PK STRL BLUE (TOWEL DISPOSABLE) ×1 IMPLANT
TRAY FOLEY W/BAG SLVR 14FR LF (SET/KITS/TRAYS/PACK) ×1 IMPLANT
WATER STERILE IRR 1000ML POUR (IV SOLUTION) ×1 IMPLANT

## 2021-12-18 NOTE — Op Note (Signed)
Cesarean Section Procedure Note  Indications: P1 at 39wks presenting for scheduled c-section.  Pre-operative Diagnosis: 1.39wks 2.h/o Cesarean Section 3.Keloid Scar   Post-operative Diagnosis: 1.39wks 2.h/o Cesarean Section 3.Omental Adhesions 4.Keloid Scar  Procedure: 1.REPEAT LOW TRANSVERSE CESAREAN SECTION 2.LOA 3.REVISION OF SCAR  Surgeon: Osborn Coho, MD    Assistants: Dr. Christianne Dolin  Anesthesia: Regional  Anesthesiologist: Lannie Fields, DO   Procedure Details  The patient was taken to the operating room secondary to h/o cesarean section after the risks, benefits, complications, treatment options, and expected outcomes were discussed with the patient.  The patient concurred with the proposed plan, giving informed consent which was signed and witnessed. The patient was taken to Operating Room B, identified as Savannah Carr and the procedure verified as C-Section Delivery. A Time Out was held and the above information confirmed.  After induction of anesthesia by obtaining a spinal, the patient was prepped and draped in the usual sterile manner. A Pfannenstiel skin incision was made and carried down through the subcutaneous tissue to the underlying layer of fascia.  The fascia was incised bilaterally and extended transversely bilaterally with the Mayo scissors. Kocher clamps were placed on the inferior aspect of the fascial incision and the underlying rectus muscle was separated from the fascia. The same was done on the superior aspect of the fascial incision.  The peritoneum was identified, entered bluntly and extended manually.  Omental adhesions noted to peritoneum and uterine wall and were excised using the bovie and doubly clamped excised and ligated with 2-0 vicryl.  An Alexis self-retaining retractor was placed.  The utero-vesical peritoneal reflection was incised transversely and the bladder flap was bluntly freed from the lower uterine segment. A low transverse  uterine incision was made with the scalpel and extended bilaterally with the bandage scissors.  The infant was delivered in vertex position without difficulty.  After the umbilical cord was clamped and cut, the infant was handed to the awaiting pediatricians.  Cord blood was obtained for evaluation.  The placenta was removed intact and appeared to be within normal limits. The uterus was cleared of all clots and debris. The uterine incision was closed with running interlocking sutures of 0 Vicryl and a second imbricating layer was performed as well.   Bilateral tubes and ovaries appeared to be within normal limits.  Good hemostasis was noted.  Copious irrigation was performed until clear.  The peritoneum was repaired with 2-0 chromic via a running suture.  The fascia was reapproximated with a running suture of 0 Vicryl.  The keloid scar was excised with the scalpel and the skin was reapproximated with a subcuticular suture of 3-0 monocryl.  Steristrips were applied, honeycomb dressing and pressure dressing.  Instrument, sponge, and needle counts were correct prior to abdominal closure and at the conclusion of the case.  The patient was awaiting transfer to the recovery room in good condition.  Findings: Live female infant with Apgars 9 at one minute and 9 at five minutes.  Normal appearing bilateral ovaries and fallopian tubes were noted.  Estimated Blood Loss:  370 ml         Drains: foley to gravity 300 cc         Total IV Fluids: 1100 ml         Specimens to Pathology: Placenta         Complications:  None; patient tolerated the procedure well.         Disposition: PACU - hemodynamically stable.  Condition: stable  Attending Attestation: I performed the procedure.  I was present and scrubbed and the assistant was required due to complexity of anatomy.

## 2021-12-18 NOTE — Anesthesia Procedure Notes (Signed)
Spinal  Patient location during procedure: OR Start time: 12/18/2021 9:43 AM End time: 12/18/2021 9:47 AM Reason for block: surgical anesthesia Staffing Performed: anesthesiologist and other anesthesia staff  Anesthesiologist: Lannie Fields, DO Performed by: Lannie Fields, DO Authorized by: Lannie Fields, DO   Preanesthetic Checklist Completed: patient identified, IV checked, risks and benefits discussed, surgical consent, monitors and equipment checked, pre-op evaluation and timeout performed Spinal Block Patient position: sitting Prep: DuraPrep and site prepped and draped Patient monitoring: cardiac monitor, continuous pulse ox and blood pressure Approach: midline Location: L3-4 Injection technique: single-shot Needle Needle type: Pencan  Needle gauge: 24 G Needle length: 9 cm Assessment Sensory level: T6 Events: CSF return Additional Notes Functioning IV was confirmed and monitors were applied. Sterile prep and drape, including hand hygiene and sterile gloves were used. The patient was positioned and the spine was prepped. The skin was anesthetized with lidocaine.  Free flow of clear CSF was obtained prior to injecting local anesthetic into the CSF.  The spinal needle aspirated freely following injection.  The needle was carefully withdrawn.  The patient tolerated the procedure well.   Performed by srna under direct supervision

## 2021-12-18 NOTE — Anesthesia Postprocedure Evaluation (Signed)
Anesthesia Post Note  Patient: Savannah Carr  Procedure(s) Performed: REPEAT CESAREAN SECTION     Patient location during evaluation: PACU Anesthesia Type: Spinal Level of consciousness: awake and alert, oriented and patient cooperative Pain management: pain level controlled Vital Signs Assessment: post-procedure vital signs reviewed and stable Respiratory status: spontaneous breathing, nonlabored ventilation and respiratory function stable Cardiovascular status: blood pressure returned to baseline and stable Postop Assessment: no apparent nausea or vomiting, no headache, no backache and spinal receding Anesthetic complications: no   No notable events documented.  Last Vitals:  Vitals:   12/18/21 1200 12/18/21 1215  BP: 117/68 103/77  Pulse: 83 85  Resp: 14 12  Temp:    SpO2: 95% 95%    Last Pain:  Vitals:   12/18/21 1140  TempSrc: Oral   Pain Goal:    LLE Motor Response: Purposeful movement (12/18/21 1215) LLE Sensation: Decreased (12/18/21 1215) RLE Motor Response: Purposeful movement (12/18/21 1215) RLE Sensation: Decreased (12/18/21 1215)     Epidural/Spinal Function Cutaneous sensation: Vague (12/18/21 1215), Patient able to flex knees: Yes (12/18/21 1215), Patient able to lift hips off bed: No (12/18/21 1215), Back pain beyond tenderness at insertion site: No (12/18/21 1215), Progressively worsening motor and/or sensory loss: No (12/18/21 1215), Bowel and/or bladder incontinence post epidural: No (12/18/21 1215)  Lannie Fields

## 2021-12-18 NOTE — Transfer of Care (Addendum)
Immediate Anesthesia Transfer of Care Note  Patient: [name]  Procedure(s) Performed: REPEAT CESAREAN SECTION  Patient Location: PACU  Anesthesia Type:Spinal  Level of Consciousness: awake, alert , and oriented  Airway & Oxygen Therapy: Patient Spontanous Breathing  Post-op Assessment: Report given to RN and Post -op Vital signs reviewed and stable  Post vital signs: Reviewed and stable  Last Vitals:  Vitals Value Taken Time  BP 115/72 12/18/21 1145  Temp    Pulse 87 12/18/21 1147  Resp 13 12/18/21 1147  SpO2 97 % 12/18/21 1147  Vitals shown include unvalidated device data.  Last Pain:  Vitals:   12/18/21 0742  TempSrc: Oral         Complications: No notable events documented.

## 2021-12-18 NOTE — H&P (Signed)
Savannah Carr is a 26 y.o. female presenting for scheduled repeat c-section. OB History     Gravida  2   Para  1   Term  1   Preterm      AB      Living  1      SAB      IAB      Ectopic      Multiple  0   Live Births  1          Past Medical History:  Diagnosis Date   Medical history non-contributory    Past Surgical History:  Procedure Laterality Date   CESAREAN SECTION N/A 05/27/2019   Procedure: CESAREAN SECTION-REQUEST RNFA;  Surgeon: Gerald Leitz, MD;  Location: [facility] LD ORS;  Service: Obstetrics;  Laterality: N/A;  Heather, RNFA   NO PAST SURGERIES     Family History: family history includes Cancer in her paternal grandfather; Hypertension in her maternal grandfather and mother. Social History:  reports that she has never smoked. She has never used smokeless tobacco. She reports that she does not drink alcohol and does not use drugs.     Maternal Diabetes: No Genetic Screening: Normal Maternal Ultrasounds/Referrals: Normal Fetal Ultrasounds or other Referrals:  None Maternal Substance Abuse:  No Significant Maternal Medications:  None Significant Maternal Lab Results:  Group B Strep negative Number of Prenatal Visits:greater than 3 verified prenatal visits Other Comments:  None  Review of Systems Denies F/C/N/V/D  History   Blood pressure 117/77, pulse (!) 106, temperature 98.3 F (36.8 C), temperature source Oral, resp. rate 20, SpO2 95 %, not currently breastfeeding. Exam Physical Exam  Lungs CTA CV RRR Abdomen gravid, NT Extremities no calf tenderness  Prenatal labs: ABO, Rh: --/--/B POS (11/25 0803) Antibody: NEG (11/25 0803) Rubella: Immune, Immune (05/04 0000) RPR: NON REACTIVE (11/22 1025)  HBsAg: Negative, Negative (05/04 0000)  HIV: Non-reactive, Non-reactive (05/04 0000)  GBS: Negative, Negative/-- (11/08 0000)   Assessment/Plan: 26 yo G2P1001 @ 39wks presenting for scheduled repeat c-section.  Risks benefits alternatives  reviewed including but not limited to bleeding infection and injury.  Questions answered and consent signed and witnessed.  Purcell Nails 12/18/2021, 9:29 AM

## 2021-12-18 NOTE — Addendum Note (Signed)
Addendum  created 12/18/21 1338 by Graciela Husbands, CRNA   Clinical Note Signed, Intraprocedure Event edited, Intraprocedure Staff edited

## 2021-12-19 LAB — CBC
HCT: 30.3 % — ABNORMAL LOW (ref 36.0–46.0)
Hemoglobin: 10.1 g/dL — ABNORMAL LOW (ref 12.0–15.0)
MCH: 31.7 pg (ref 26.0–34.0)
MCHC: 33.3 g/dL (ref 30.0–36.0)
MCV: 95 fL (ref 80.0–100.0)
Platelets: 371 10*3/uL (ref 150–400)
RBC: 3.19 MIL/uL — ABNORMAL LOW (ref 3.87–5.11)
RDW: 12.5 % (ref 11.5–15.5)
WBC: 20.6 10*3/uL — ABNORMAL HIGH (ref 4.0–10.5)
nRBC: 0 % (ref 0.0–0.2)

## 2021-12-19 LAB — RPR: RPR Ser Ql: NONREACTIVE

## 2021-12-19 NOTE — Progress Notes (Signed)
Savannah Carr [id number] Postpartum Postoperative Day # 1  Savannah Carr, [id number], [redacted]w[redacted]d, S/P Repeat LT Cesarean Section due to Elective scheduled Repeat. Pt was addmitted on 11/25 for RCS, was taken to the OR with Dr Su Hilt on 11/25, QBL was , hgb drop of 11.2-10.1, had viable baby female whom desires in pt circ.   Patient Active Problem List   Diagnosis Date Noted   Status post repeat low transverse cesarean section 12/18/2021   Normal postpartum course 12/18/2021     Active Ambulatory Problems    Diagnosis Date Noted   No Active Ambulatory Problems   Resolved Ambulatory Problems    Diagnosis Date Noted   S/P cesarean section 05/27/2019   Past Medical History:  Diagnosis Date   Medical history non-contributory      Subjective: Patient up ad lib, denies syncope or dizziness. Reports consuming regular diet without issues and denies N/V. Patient reports 0 bowel movement + passing flatus.  Denies issues with urination and reports bleeding is "lighter."  Patient is Br/Bt feeding and reports going well.  Desires undecided for postpartum contraception.  Pain is being appropriately managed with use of po meds.   Objective: Patient Vitals for the past 24 hrs:  BP Temp Temp src Pulse Resp SpO2  12/19/21 0556 114/76 97.6 F (36.4 C) Oral 79 17 100 %  12/19/21 0126 108/77 98.1 F (36.7 C) Oral 80 16 100 %  12/18/21 2136 111/70 97.6 F (36.4 C) Oral 63 17 100 %  12/18/21 1732 -- 98.9 F (37.2 C) -- -- -- 100 %  12/18/21 1540 119/78 98.4 F (36.9 C) -- (!) 103 16 96 %  12/18/21 1434 110/71 97.9 F (36.6 C) -- 78 16 100 %  12/18/21 1259 104/75 97.6 F (36.4 C) Oral 82 15 100 %  12/18/21 1240 (!) 133/94 -- -- 81 13 95 %  12/18/21 1230 113/71 97.7 F (36.5 C) Oral 73 12 94 %  12/18/21 1215 103/77 -- -- 85 12 95 %  12/18/21 1200 117/68 -- -- 83 14 95 %  12/18/21 1145 115/72 -- -- 85 19 92 %  12/18/21 1140 97/85 97.6 F (36.4 C) Oral (!) 103 18 92 %     Physical  Exam:  General: alert, cooperative, and appears stated age Mood/Affect: happy Lungs: clear to auscultation, no wheezes, rales or rhonchi, symmetric air entry.  Heart: normal rate, regular rhythm, normal S1, S2, no murmurs, rubs, clicks or gallops. Breast: breasts appear normal, no suspicious masses, no skin or nipple changes or axillary nodes. Abdomen:  + bowel sounds, soft, non-tender Incision: healing well, no significant drainage, no dehiscence, no significant erythema, Honeycomb dressing  Uterine Fundus: firm, involution -1 Lochia: appropriate Skin: Warm, Dry. DVT Evaluation: No evidence of DVT seen on physical exam. Negative Homan's sign. No cords or calf tenderness. No significant calf/ankle edema.  Labs: Recent Labs    12/18/21 1402 12/19/21 0424  HGB 11.0* 10.1*  HCT 32.4* 30.3*  WBC 22.5* 20.6*    CBG (last 3)  No results for input(s): "GLUCAP" in the last 72 hours.   I/O: I/O last 3 completed shifts: In: 2500 [P.O.:1500; I.V.:1000] Out: 1975 [Urine:1480; Blood:495]   Assessment Postpartum Postoperative Day # 1  Savannah Carr, [id number], [redacted]w[redacted]d, S/P Repeat LT Cesarean Section due to Elective scheduled Repeat. Pt was addmitted on 11/25 for RCS, was taken to the OR with Dr Su Hilt on 11/25, QBL was , hgb drop of 11.2-10.1, had viable baby female  whom desires in pt circ.  Pt stable. -1 Involution. BrBtFeeding. Hemodynamically Stable.  Plan: Continue other mgmt as ordered Baby Female: In pt circ prior to discharge VTE Prophylactics: SCD, ambulated as tolerates.  Pain control: Motrin/Tylenol/Narcotics PRN Education given regarding options for contraception, including barrier methods, injectable contraception, IUD placement, oral contraceptives.  Plan for discharge tomorrow, Breastfeeding, Lactation consult, and Circumcision prior to discharge  Dr. Su Hilt to be updated on patient status  [facility] CNM, FNP-C, PMHNP-BC  [phone]   [place], [place] [address]  Cell: [phone]  Office Phone: [phone] Fax: [phone] 12/19/2021  10:01 AM

## 2021-12-20 MED ORDER — OXYCODONE HCL 5 MG PO TABS: 5.0000 mg | ORAL_TABLET | Freq: Four times a day (QID) | ORAL | 0 refills | Status: AC | PRN

## 2021-12-20 MED ORDER — DIBUCAINE (PERIANAL) 1 % EX OINT: 1.0000 | TOPICAL_OINTMENT | CUTANEOUS | 1 refills | Status: AC | PRN

## 2021-12-20 MED ORDER — IBUPROFEN 600 MG PO TABS: 600.0000 mg | ORAL_TABLET | Freq: Four times a day (QID) | ORAL | 0 refills | Status: AC

## 2021-12-20 MED ORDER — SENNOSIDES-DOCUSATE SODIUM 8.6-50 MG PO TABS: 2.0000 | ORAL_TABLET | Freq: Every day | ORAL | 1 refills | Status: AC

## 2021-12-20 NOTE — Discharge Summary (Signed)
Postpartum Discharge Summary  Date of Service updated11/27/23     Patient Name: Savannah Carr DOB: [birth year] MRN: [id number]  Date of admission: 12/18/2021 Delivery date:12/18/2021  Delivering provider: Everett Graff  Date of discharge: 12/20/2021  Admitting diagnosis: Status post repeat low transverse cesarean section [Z98.891] Intrauterine pregnancy: [redacted]w[redacted]d    Secondary diagnosis:  Principal Problem:   Status post repeat low transverse cesarean section Active Problems:   Normal postpartum course  Additional problems: na    Discharge diagnosis: Term Pregnancy Delivered                                              Post partum procedures: none Augmentation: N/A Complications: None  Hospital course: Sceduled C/S   26y.o. yo G2P2002 at [id number] admitted to the hospital 12/18/2021 for scheduled cesarean section with the following indication:Elective Repeat.Delivery details are as follows:  Membrane Rupture Time/Date: 10:21 AM ,12/18/2021   Delivery Method:C-Section, Low Transverse  Details of operation can be found in separate operative note.  Patient had a postpartum course complicated by nothing.  She is ambulating, tolerating a regular diet, passing flatus, and urinating well. Patient is discharged home in stable condition on  12/20/21        Newborn Data: Birth date:12/18/2021  Birth time:10:21 AM  Gender:Female  Living status:Living  Apgars:9 ,9  Weight:3130 g     Magnesium Sulfate received: No BMZ received: No Rhophylac:No MMR:No T-DaP: not given Flu: No Transfusion:No  Physical exam  Vitals:   12/19/21 0556 12/19/21 1237 12/19/21 2110 12/20/21 0629  BP: 114/76 115/71 112/71 102/65  Pulse: 79 88 89 82  Resp: _0 Temp: 97.6 F (36.4 C) 98.2 F (36.8 C) 97.9 F (36.6 C) 98.3 F (36.8 C)  TempSrc: Oral Oral Oral Oral  SpO2: 100% 98%    Weight:      Height:       General: alert and cooperative Lochia: appropriate Uterine Fundus:  firm Incision: Healing well with no significant drainage DVT Evaluation: No evidence of DVT seen on physical exam. Labs: Lab Results  Component Value Date   WBC 20.6 (H) 12/19/2021   HGB 10.1 (L) 12/19/2021   HCT 30.3 (L) 12/19/2021   MCV 95.0 12/19/2021   PLT 371 12/19/2021      Latest Ref Rng & Units 12/18/2021    2:02 PM  CMP  Glucose 70 - 99 mg/dL 83   BUN 6 - 20 mg/dL 7   Creatinine 0.44 - 1.00 mg/dL 0.46   Sodium 135 - 145 mmol/L 135   Potassium 3.5 - 5.1 mmol/L 3.8   Chloride 98 - 111 mmol/L 105   CO2 22 - 32 mmol/L 20   Calcium 8.9 - 10.3 mg/dL 8.9    Edinburgh Score:    12/18/2021    5:32 PM  Edinburgh Postnatal Depression Scale Screening Tool  I have been able to laugh and see the funny side of things. 0  I have looked forward with enjoyment to things. 0  I have blamed myself unnecessarily when things went wrong. 0  I have been anxious or worried for no good reason. 0  I have felt scared or panicky for no good reason. 0  Things have been getting on top of me. 0  I have been so unhappy that I have had difficulty  sleeping. 0  I have felt sad or miserable. 0  I have been so unhappy that I have been crying. 0  The thought of harming myself has occurred to me. 0  Edinburgh Postnatal Depression Scale Total 0      After visit meds:  Allergies as of 12/20/2021   No Known Allergies      Medication List     TAKE these medications    dibucaine 1 % Oint Commonly known as: NUPERCAINAL Place 1 Application rectally as needed for hemorrhoids.   ferrous sulfate 325 (65 FE) MG tablet Take 325 mg by mouth daily.   ibuprofen 600 MG tablet Commonly known as: ADVIL Take 1 tablet (600 mg total) by mouth every 6 (six) hours.   oxyCODONE 5 MG immediate release tablet Commonly known as: Oxy IR/ROXICODONE Take 1 tablet (5 mg total) by mouth every 6 (six) hours as needed for moderate pain.   prenatal multivitamin Tabs tablet Take 1 tablet by mouth daily.    promethazine 25 MG tablet Commonly known as: PHENERGAN Take 25 mg by mouth every 8 (eight) hours as needed for nausea or vomiting.   senna-docusate 8.6-50 MG tablet Commonly known as: Senokot-S Take 2 tablets by mouth daily. Start taking on: December 21, 2021   vitamin D3 50 MCG (2000 UT) Caps Take 2,000 Units by mouth daily.         Discharge home in stable condition Infant Feeding: Bottle Infant Disposition:home with mother Discharge instruction: per After Visit Summary and Postpartum booklet. Activity: Advance as tolerated. Pelvic rest for 6 weeks.  Diet: routine diet Anticipated Birth Control: Unsure Postpartum Appointment:6 weeks Additional Postpartum F/U:  none Future Appointments:No future appointments. Follow up Visit:  [facility]. Schedule an appointment as soon as possible for a visit in 6 week(s).   Specialty: Obstetrics and Gynecology Contact information: [address] [place] [address] [phone]                    12/20/2021 Betsy Coder, MD

## 2021-12-27 ENCOUNTER — Telehealth (HOSPITAL_COMMUNITY): Payer: Self-pay | Admitting: *Deleted

## 2021-12-27 NOTE — Telephone Encounter (Signed)
Left phone voicemail message.  Duffy Rhody, RN 12-27-2021 at 10:05am

## 2021-12-30 IMAGING — US US OB TRANSVAGINAL
1 series · 13 of 28 positions shown · non-contrast
Comparison: None

CLINICAL DATA: First trimester pregnancy, vaginal bleeding, LMP
06/22/2020

EXAM:
OBSTETRIC <14 WK US AND TRANSVAGINAL OB US
TECHNIQUE: Both transabdominal and transvaginal ultrasound examinations were
performed for complete evaluation of the gestation as well as the
maternal uterus, adnexal regions, and pelvic cul-de-sac.
Transvaginal technique was performed to assess early pregnancy.

[Series 1: us ob transvaginal · 13 of 97 slices shown]
[im 4/97]
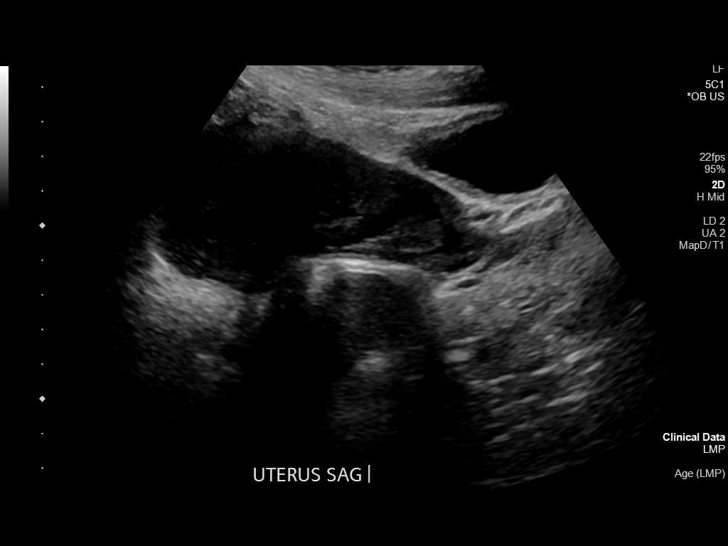
[im 11/97]
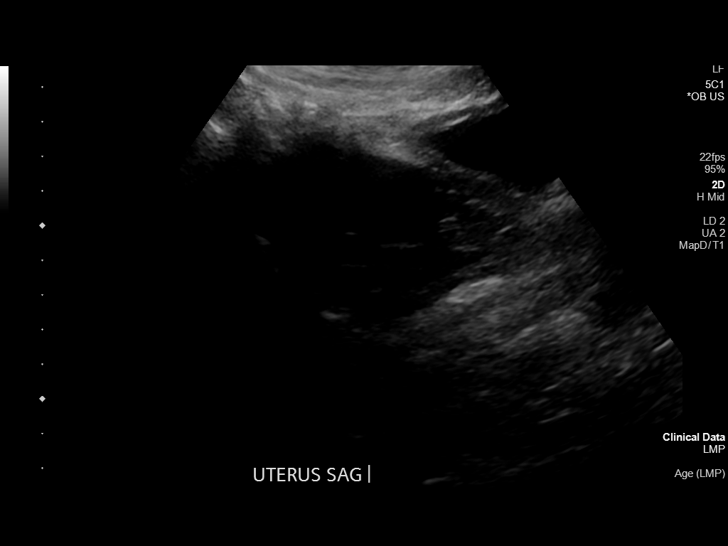
[im 18/97]
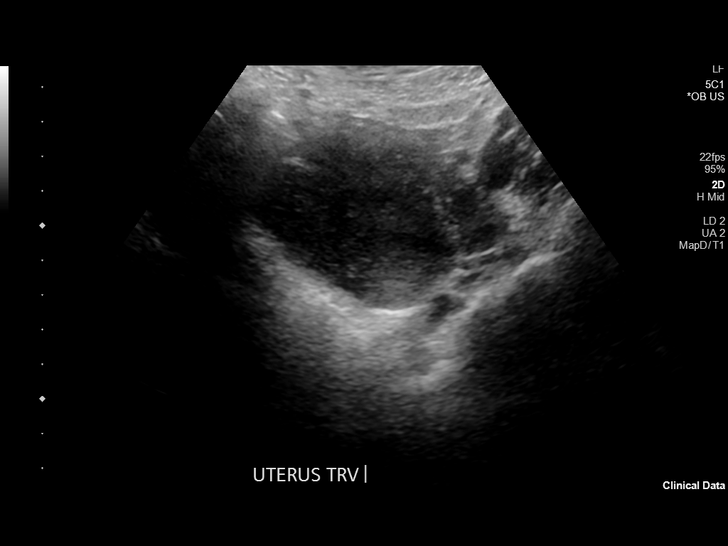
[im 25/97]
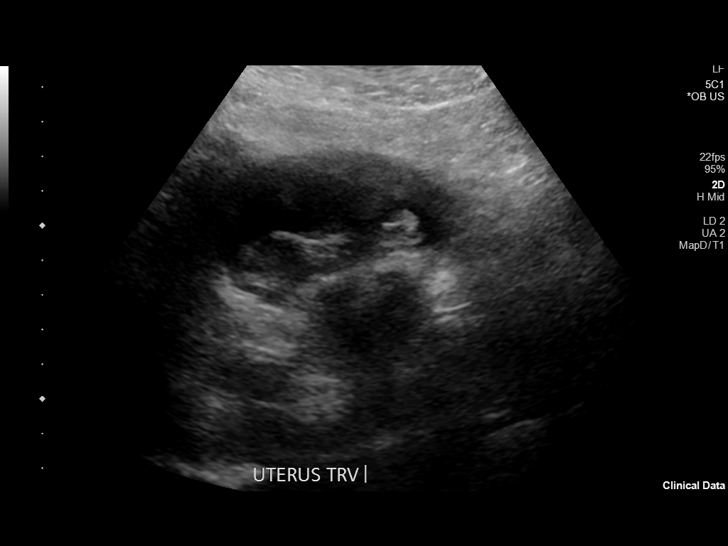
[im 33/97]
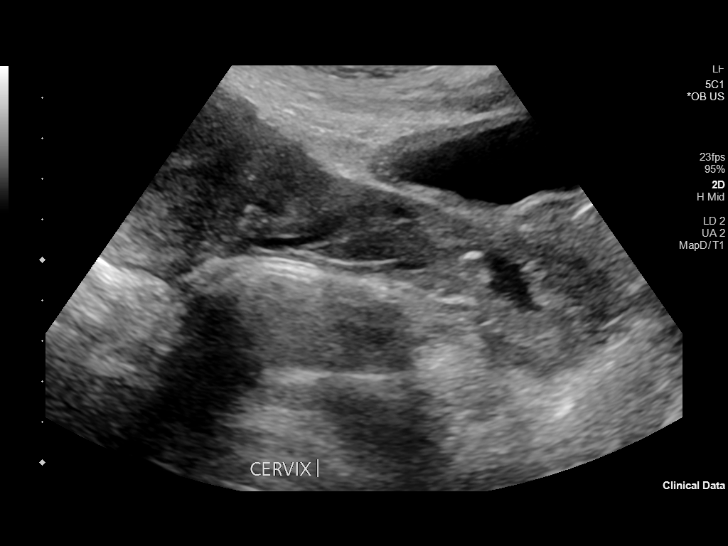
[im 40/97]
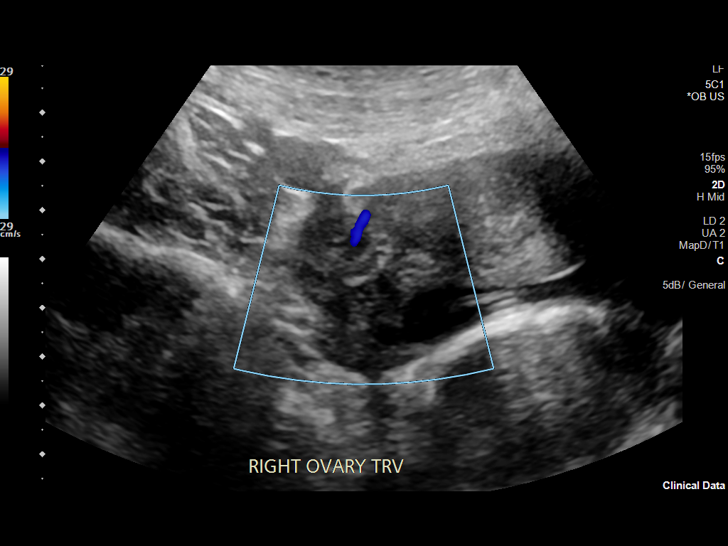
[im 50/97]
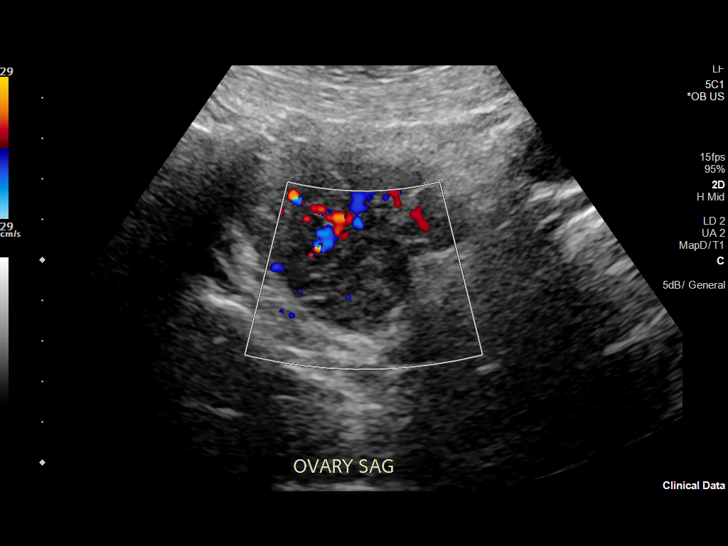
[im 57/97]
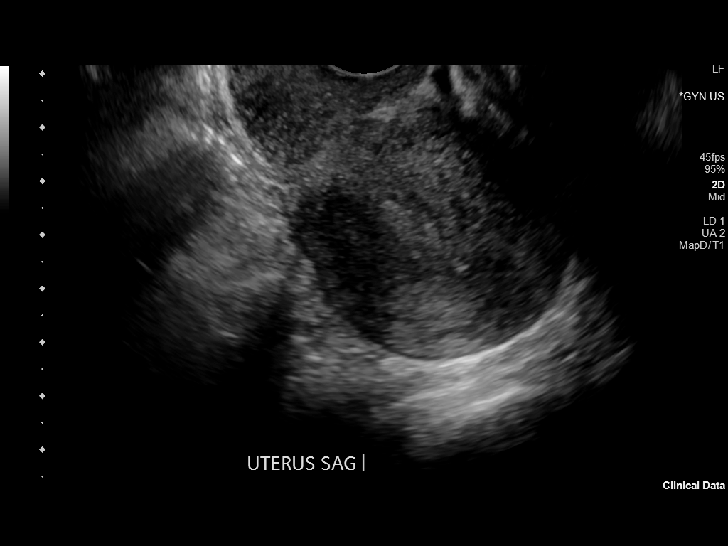
[im 65/97]
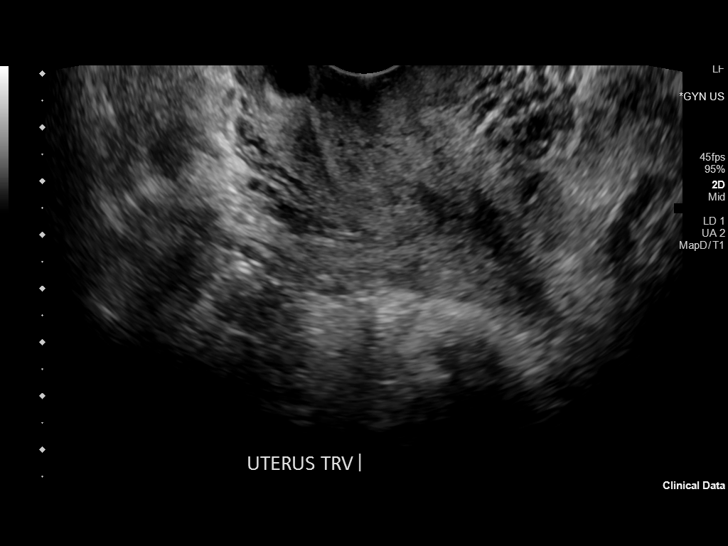
[im 72/97]
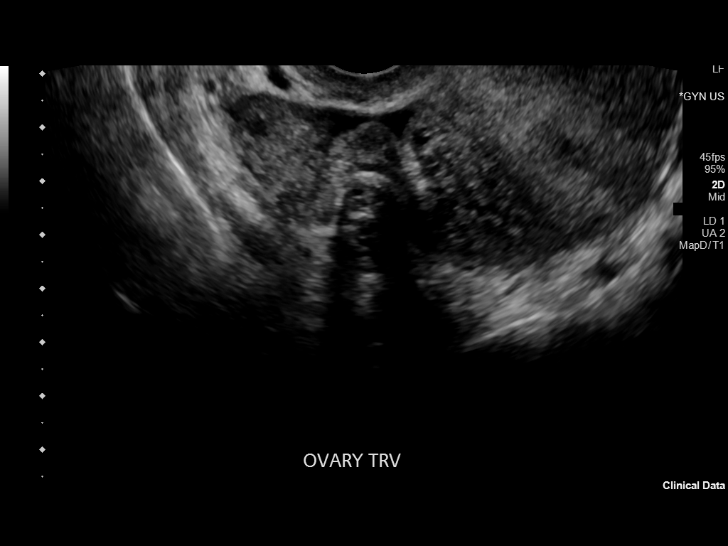
[im 79/97]
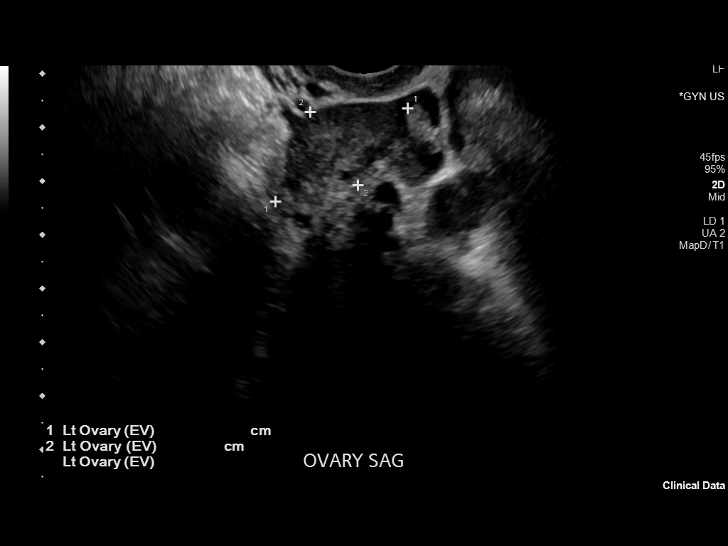
[im 86/97]
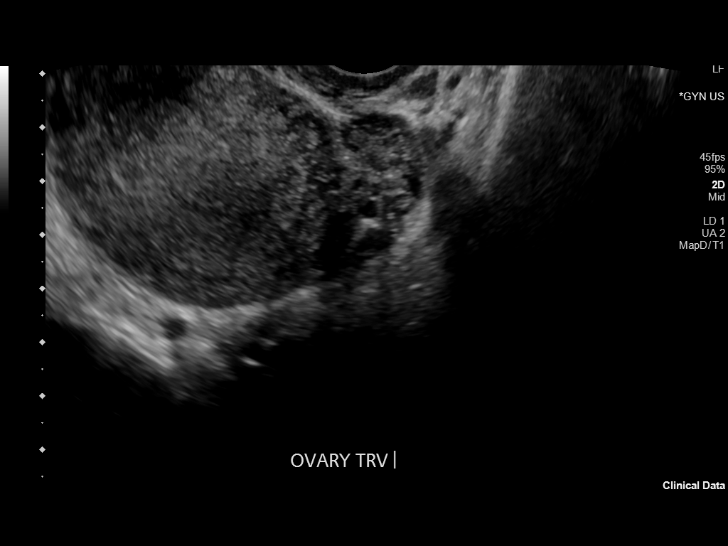
[im 93/97]
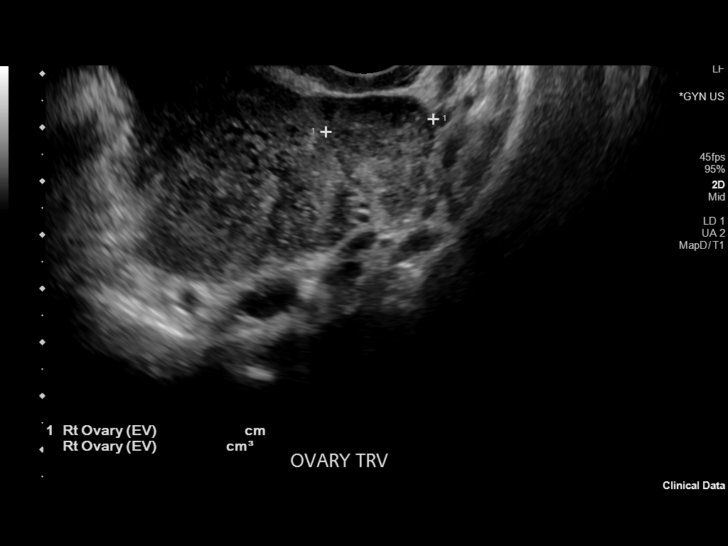

[13 of 28 positions shown; findings below may reference images not displayed]

FINDINGS: Intrauterine gestational sac: None identified

Yolk sac:  N/A

Embryo:  N/A

Cardiac Activity: N/A

Heart Rate: N/A  bpm

MSD:   mm    w     d

CRL:    mm    w    d                  US EDC:

Subchorionic hemorrhage:  N/A

Maternal uterus/adnexae:

Uterus retroverted, unremarkable.

No focal uterine mass.

Endometrial complex 8 mm thick without fluid or gestational sac.

Ovaries unremarkable.

Trace free pelvic fluid.

No adnexal masses.
IMPRESSION: No intrauterine gestation identified.

Findings are consistent with pregnancy of unknown location.

Differential diagnosis includes early intrauterine pregnancy too
early to visualize, spontaneous abortion, and ectopic pregnancy.

Serial quantitative beta HCG and or follow-up ultrasound recommended
to definitively exclude ectopic pregnancy.

## 2021-12-30 IMAGING — US US OB COMP LESS 14 WK
1 series · 13 of 28 positions shown · non-contrast
Comparison: None

CLINICAL DATA: First trimester pregnancy, vaginal bleeding, LMP
06/22/2020

EXAM:
OBSTETRIC <14 WK US AND TRANSVAGINAL OB US
TECHNIQUE: Both transabdominal and transvaginal ultrasound examinations were
performed for complete evaluation of the gestation as well as the
maternal uterus, adnexal regions, and pelvic cul-de-sac.
Transvaginal technique was performed to assess early pregnancy.

[Series 1: us ob comp less 14 wk · 13 of 97 slices shown]
[im 4/97]
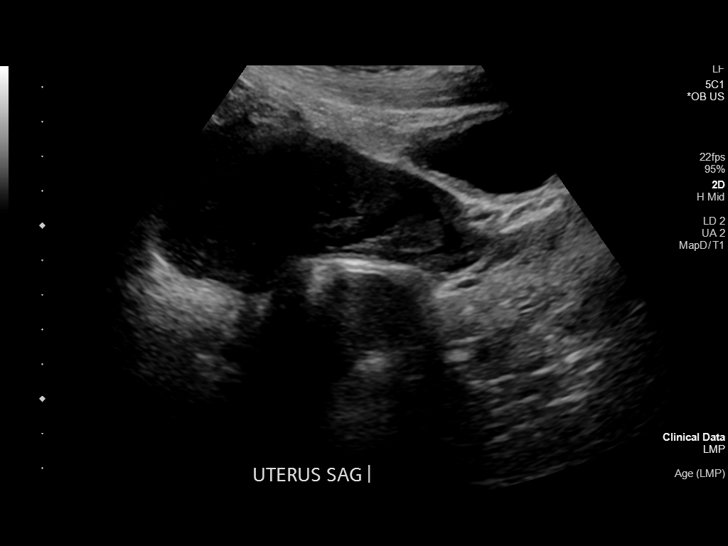
[im 11/97]
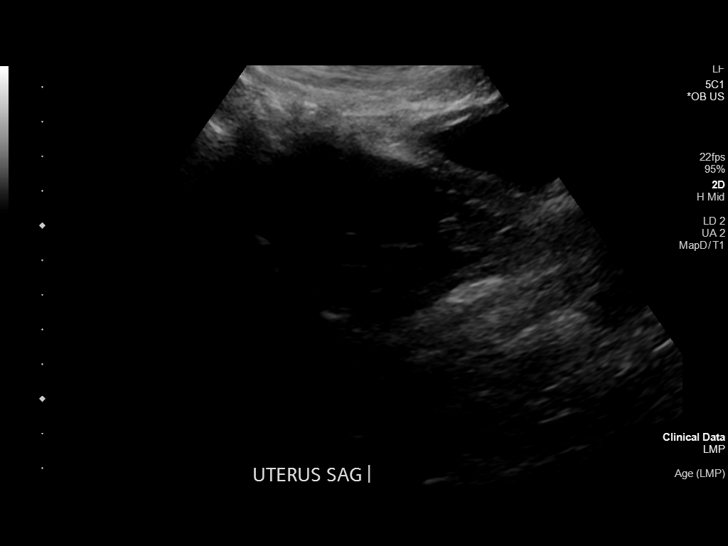
[im 18/97]
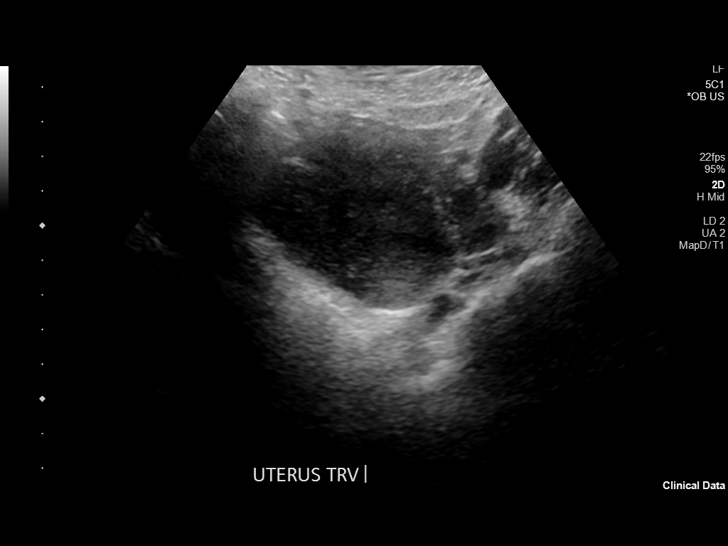
[im 25/97]
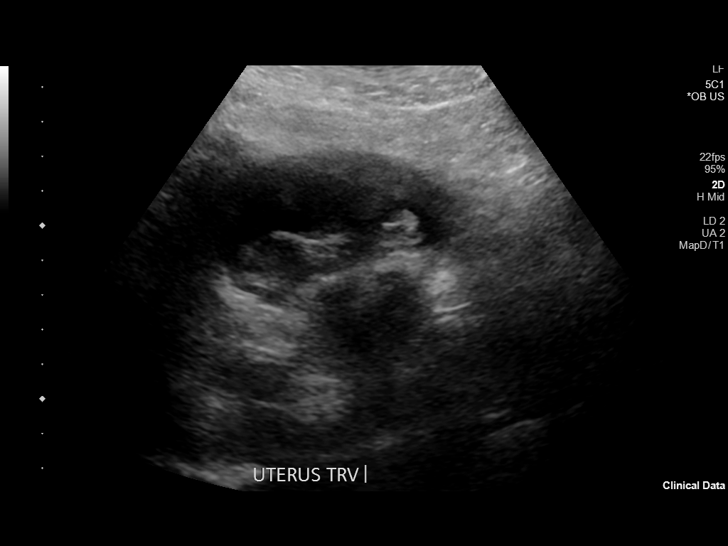
[im 33/97]
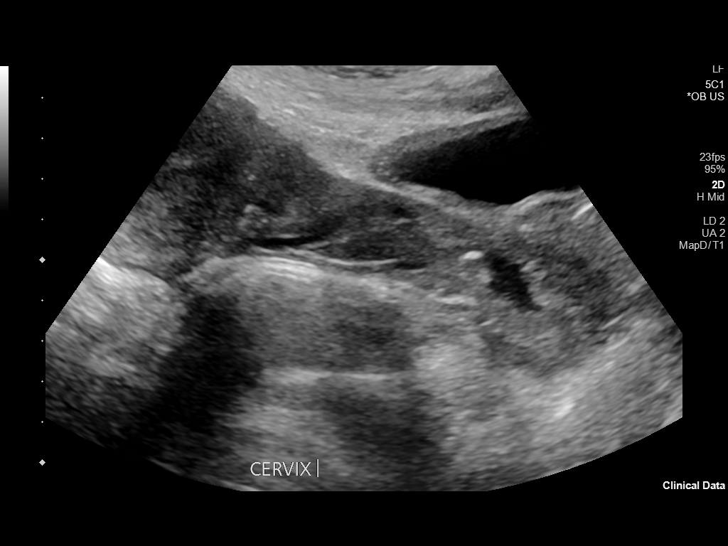
[im 40/97]
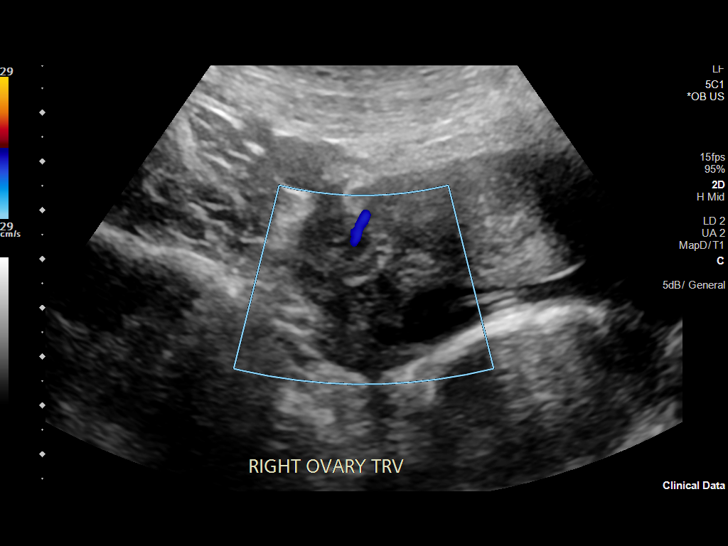
[im 50/97]
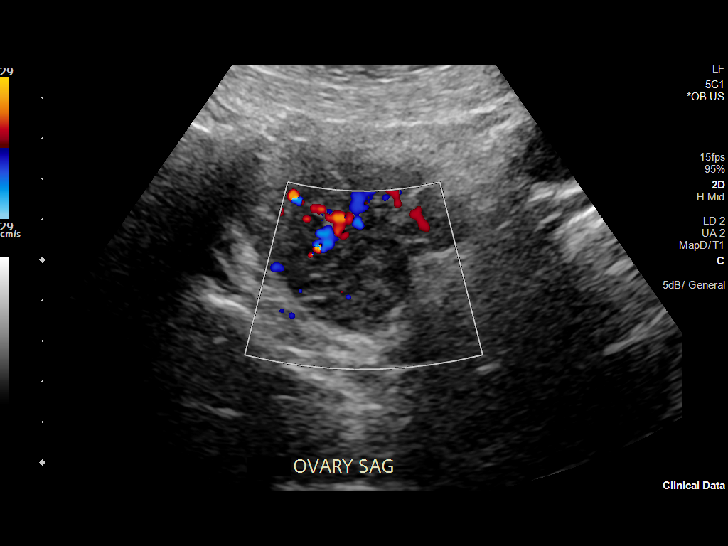
[im 57/97]
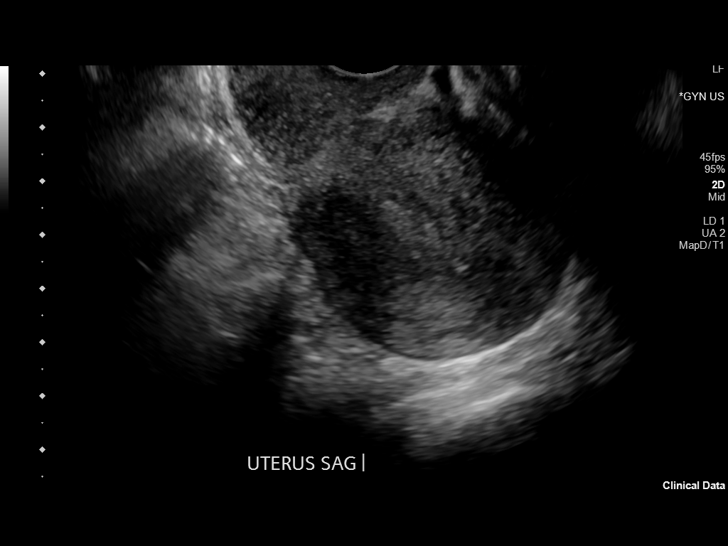
[im 65/97]
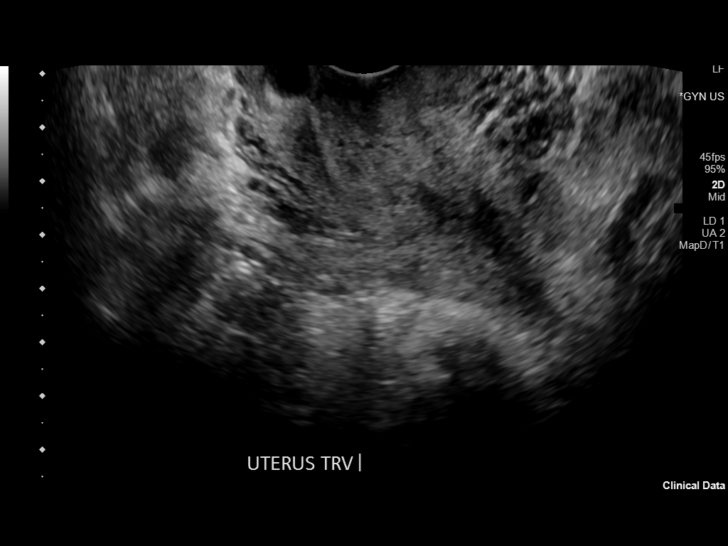
[im 72/97]
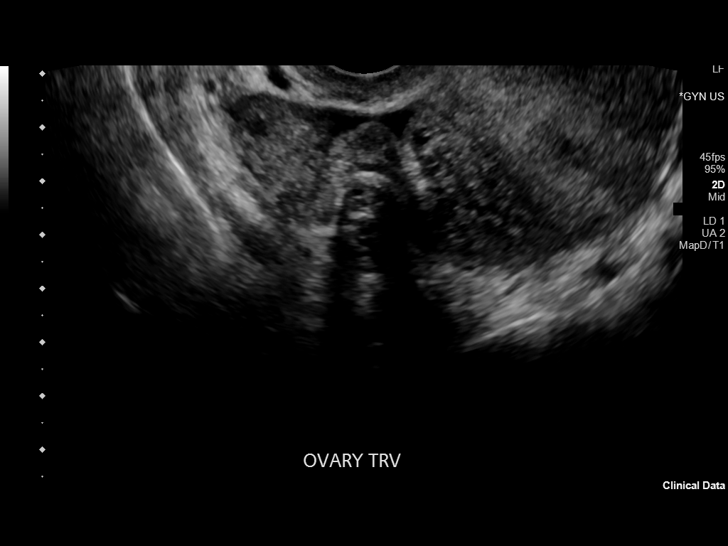
[im 79/97]
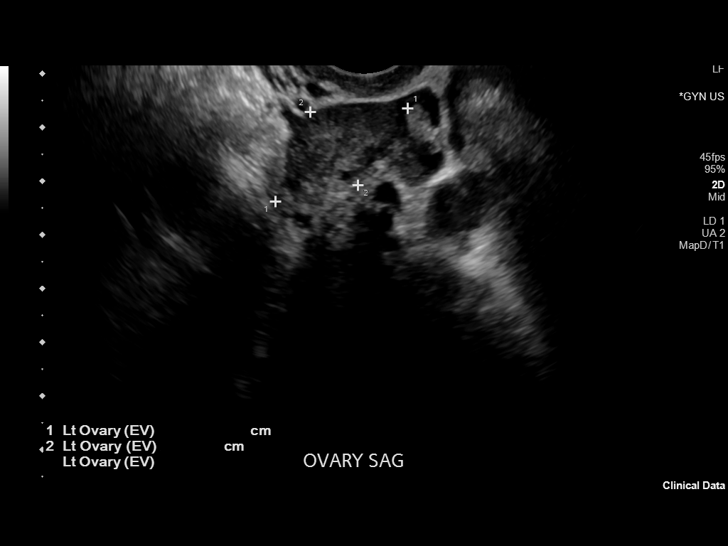
[im 86/97]
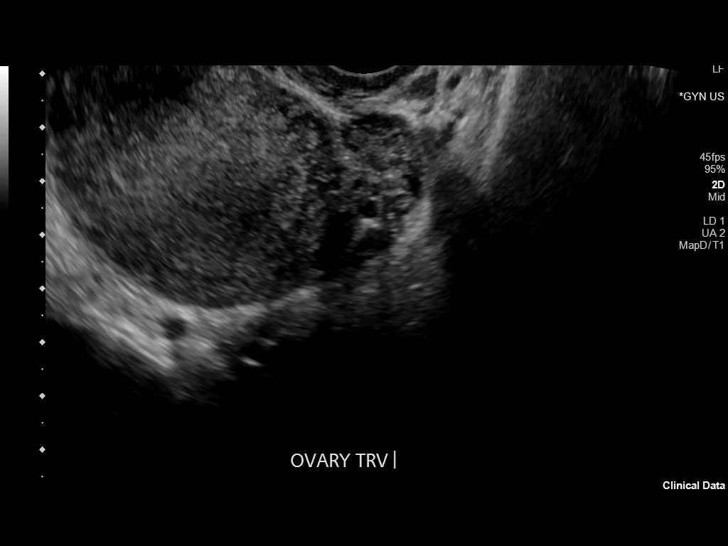
[im 93/97]
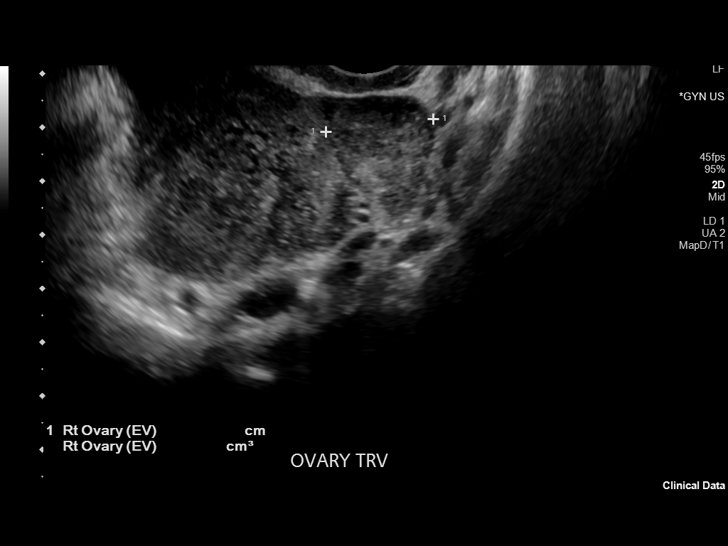

[13 of 28 positions shown; findings below may reference images not displayed]

FINDINGS: Intrauterine gestational sac: None identified

Yolk sac:  N/A

Embryo:  N/A

Cardiac Activity: N/A

Heart Rate: N/A  bpm

MSD:   mm    w     d

CRL:    mm    w    d                  US EDC:

Subchorionic hemorrhage:  N/A

Maternal uterus/adnexae:

Uterus retroverted, unremarkable.

No focal uterine mass.

Endometrial complex 8 mm thick without fluid or gestational sac.

Ovaries unremarkable.

Trace free pelvic fluid.

No adnexal masses.
IMPRESSION: No intrauterine gestation identified.

Findings are consistent with pregnancy of unknown location.

Differential diagnosis includes early intrauterine pregnancy too
early to visualize, spontaneous abortion, and ectopic pregnancy.

Serial quantitative beta HCG and or follow-up ultrasound recommended
to definitively exclude ectopic pregnancy.
